# Patient Record
Sex: Male | Born: 1945 | Race: Black or African American | Hispanic: No | Marital: Married | Smoking: Never smoker
Health system: Southern US, Community
[De-identification: ages and names within clinical notes are randomized; demographics above are authoritative.]

## PROBLEM LIST (undated history)

## (undated) DIAGNOSIS — M75100 Unspecified rotator cuff tear or rupture of unspecified shoulder, not specified as traumatic: Secondary | ICD-10-CM

## (undated) DIAGNOSIS — E119 Type 2 diabetes mellitus without complications: Secondary | ICD-10-CM

## (undated) DIAGNOSIS — N400 Enlarged prostate without lower urinary tract symptoms: Secondary | ICD-10-CM

## (undated) DIAGNOSIS — M1990 Unspecified osteoarthritis, unspecified site: Secondary | ICD-10-CM

## (undated) DIAGNOSIS — Z9189 Other specified personal risk factors, not elsewhere classified: Secondary | ICD-10-CM

## (undated) DIAGNOSIS — R519 Headache, unspecified: Secondary | ICD-10-CM

## (undated) DIAGNOSIS — J45909 Unspecified asthma, uncomplicated: Secondary | ICD-10-CM

## (undated) DIAGNOSIS — I639 Cerebral infarction, unspecified: Secondary | ICD-10-CM

## (undated) DIAGNOSIS — M109 Gout, unspecified: Secondary | ICD-10-CM

## (undated) DIAGNOSIS — R51 Headache: Secondary | ICD-10-CM

## (undated) DIAGNOSIS — M459 Ankylosing spondylitis of unspecified sites in spine: Secondary | ICD-10-CM

## (undated) DIAGNOSIS — I10 Essential (primary) hypertension: Secondary | ICD-10-CM

## (undated) DIAGNOSIS — Z889 Allergy status to unspecified drugs, medicaments and biological substances status: Secondary | ICD-10-CM

## (undated) DIAGNOSIS — G4730 Sleep apnea, unspecified: Secondary | ICD-10-CM

## (undated) DIAGNOSIS — Z9889 Other specified postprocedural states: Secondary | ICD-10-CM

## (undated) HISTORY — PX: NASAL SINUS SURGERY: SHX719

## (undated) HISTORY — PX: KNEE ARTHROSCOPY: SHX127

## (undated) HISTORY — PX: UVULOPALATOPHARYNGOPLASTY: SHX827

---

## 2005-08-22 ENCOUNTER — Ambulatory Visit: Payer: Self-pay | Admitting: Gastroenterology

## 2006-07-21 ENCOUNTER — Ambulatory Visit: Payer: Self-pay | Admitting: Internal Medicine

## 2006-12-03 ENCOUNTER — Ambulatory Visit: Payer: Self-pay | Admitting: Internal Medicine

## 2007-07-17 ENCOUNTER — Ambulatory Visit: Payer: Self-pay | Admitting: Otolaryngology

## 2008-10-28 ENCOUNTER — Ambulatory Visit: Payer: Self-pay | Admitting: Gastroenterology

## 2008-11-06 ENCOUNTER — Ambulatory Visit: Payer: Self-pay | Admitting: Otolaryngology

## 2010-02-09 ENCOUNTER — Ambulatory Visit: Payer: Self-pay | Admitting: Otolaryngology

## 2010-02-18 ENCOUNTER — Ambulatory Visit: Payer: Self-pay | Admitting: Otolaryngology

## 2010-02-25 ENCOUNTER — Ambulatory Visit: Payer: Self-pay | Admitting: Otolaryngology

## 2011-05-26 ENCOUNTER — Other Ambulatory Visit: Payer: Self-pay | Admitting: Rheumatology

## 2011-05-26 LAB — SYNOVIAL CELL COUNT + DIFF, W/ CRYSTALS
Basophil: 0 %
Lymphocytes: 7 %
Neutrophils: 92 %
Nucleated Cell Count: 27103 /mm3
Other Cells BF: 0 %
Other Mononuclear Cells: 1 %

## 2011-06-14 ENCOUNTER — Encounter: Payer: Self-pay | Admitting: Urology

## 2011-06-15 ENCOUNTER — Encounter: Payer: Self-pay | Admitting: Urology

## 2011-06-22 LAB — HEMOGLOBIN: HGB: 15.3 g/dL (ref 13.0–18.0)

## 2011-06-23 ENCOUNTER — Ambulatory Visit: Payer: Self-pay | Admitting: Rheumatology

## 2011-07-16 ENCOUNTER — Encounter: Payer: Self-pay | Admitting: Urology

## 2011-10-05 ENCOUNTER — Encounter: Payer: Self-pay | Admitting: Urology

## 2011-10-05 LAB — CBC WITH DIFFERENTIAL/PLATELET
Basophil #: 0.1 10*3/uL (ref 0.0–0.1)
Eosinophil #: 0.4 10*3/uL (ref 0.0–0.7)
Lymphocyte #: 2.7 10*3/uL (ref 1.0–3.6)
MCV: 88 fL (ref 80–100)
Monocyte %: 9.6 %
Neutrophil #: 1.8 10*3/uL (ref 1.4–6.5)
Neutrophil %: 32.5 %
Platelet: 173 10*3/uL (ref 150–440)
RBC: 6.15 10*6/uL — ABNORMAL HIGH (ref 4.40–5.90)
RDW: 14.5 % (ref 11.5–14.5)
WBC: 5.5 10*3/uL (ref 3.8–10.6)

## 2011-10-12 LAB — HEMATOCRIT: HCT: 48.1 % (ref 40.0–52.0)

## 2011-11-23 ENCOUNTER — Ambulatory Visit: Payer: Self-pay | Admitting: Gastroenterology

## 2011-12-27 ENCOUNTER — Ambulatory Visit: Payer: Self-pay | Admitting: Gastroenterology

## 2012-12-13 ENCOUNTER — Ambulatory Visit: Payer: Self-pay | Admitting: Specialist

## 2013-03-12 ENCOUNTER — Ambulatory Visit: Payer: Self-pay | Admitting: Internal Medicine

## 2013-08-28 DIAGNOSIS — R059 Cough, unspecified: Secondary | ICD-10-CM | POA: Insufficient documentation

## 2013-08-28 DIAGNOSIS — J45909 Unspecified asthma, uncomplicated: Secondary | ICD-10-CM | POA: Diagnosis present

## 2013-08-29 DIAGNOSIS — K635 Polyp of colon: Secondary | ICD-10-CM | POA: Insufficient documentation

## 2013-08-29 DIAGNOSIS — Z87898 Personal history of other specified conditions: Secondary | ICD-10-CM | POA: Insufficient documentation

## 2013-08-29 DIAGNOSIS — M1990 Unspecified osteoarthritis, unspecified site: Secondary | ICD-10-CM | POA: Insufficient documentation

## 2013-08-29 DIAGNOSIS — Z87438 Personal history of other diseases of male genital organs: Secondary | ICD-10-CM | POA: Diagnosis present

## 2013-08-29 DIAGNOSIS — E119 Type 2 diabetes mellitus without complications: Secondary | ICD-10-CM | POA: Insufficient documentation

## 2013-08-29 DIAGNOSIS — M459 Ankylosing spondylitis of unspecified sites in spine: Secondary | ICD-10-CM | POA: Insufficient documentation

## 2013-08-29 DIAGNOSIS — D126 Benign neoplasm of colon, unspecified: Secondary | ICD-10-CM | POA: Insufficient documentation

## 2013-08-29 DIAGNOSIS — E785 Hyperlipidemia, unspecified: Secondary | ICD-10-CM | POA: Insufficient documentation

## 2013-11-28 DIAGNOSIS — J329 Chronic sinusitis, unspecified: Secondary | ICD-10-CM | POA: Diagnosis present

## 2013-12-04 ENCOUNTER — Ambulatory Visit: Payer: Self-pay | Admitting: Internal Medicine

## 2014-05-06 ENCOUNTER — Ambulatory Visit: Payer: Self-pay | Admitting: Rheumatology

## 2014-05-28 DIAGNOSIS — M7582 Other shoulder lesions, left shoulder: Secondary | ICD-10-CM | POA: Insufficient documentation

## 2014-06-23 ENCOUNTER — Other Ambulatory Visit: Payer: Self-pay

## 2014-07-03 ENCOUNTER — Ambulatory Visit: Admit: 2014-07-03 | Payer: Self-pay

## 2014-07-03 SURGERY — ARTHROSCOPY, SHOULDER
Anesthesia: Choice | Laterality: Left

## 2014-07-23 ENCOUNTER — Encounter
Admission: RE | Disposition: A | Discharge status: Home / Self Care | Payer: Self-pay | Source: Ambulatory Visit | Attending: Surgery

## 2014-07-23 ENCOUNTER — Ambulatory Visit: Payer: Medicare Other | Admitting: Anesthesiology

## 2014-07-23 ENCOUNTER — Ambulatory Visit
Admission: RE | Admit: 2014-07-23 | Discharge: 2014-07-23 | Disposition: A | Discharge status: Home / Self Care | Payer: Medicare Other | Source: Ambulatory Visit | Attending: Surgery | Admitting: Surgery

## 2014-07-23 DIAGNOSIS — D72820 Lymphocytosis (symptomatic): Secondary | ICD-10-CM | POA: Insufficient documentation

## 2014-07-23 DIAGNOSIS — M459 Ankylosing spondylitis of unspecified sites in spine: Secondary | ICD-10-CM | POA: Insufficient documentation

## 2014-07-23 DIAGNOSIS — M75102 Unspecified rotator cuff tear or rupture of left shoulder, not specified as traumatic: Secondary | ICD-10-CM | POA: Insufficient documentation

## 2014-07-23 DIAGNOSIS — E559 Vitamin D deficiency, unspecified: Secondary | ICD-10-CM | POA: Insufficient documentation

## 2014-07-23 DIAGNOSIS — Z8041 Family history of malignant neoplasm of ovary: Secondary | ICD-10-CM | POA: Insufficient documentation

## 2014-07-23 DIAGNOSIS — M7592 Shoulder lesion, unspecified, left shoulder: Secondary | ICD-10-CM | POA: Diagnosis present

## 2014-07-23 DIAGNOSIS — J45909 Unspecified asthma, uncomplicated: Secondary | ICD-10-CM | POA: Insufficient documentation

## 2014-07-23 DIAGNOSIS — N400 Enlarged prostate without lower urinary tract symptoms: Secondary | ICD-10-CM | POA: Insufficient documentation

## 2014-07-23 DIAGNOSIS — Z823 Family history of stroke: Secondary | ICD-10-CM | POA: Insufficient documentation

## 2014-07-23 DIAGNOSIS — Z79899 Other long term (current) drug therapy: Secondary | ICD-10-CM | POA: Diagnosis not present

## 2014-07-23 DIAGNOSIS — I10 Essential (primary) hypertension: Secondary | ICD-10-CM | POA: Insufficient documentation

## 2014-07-23 DIAGNOSIS — E119 Type 2 diabetes mellitus without complications: Secondary | ICD-10-CM | POA: Diagnosis not present

## 2014-07-23 DIAGNOSIS — M1990 Unspecified osteoarthritis, unspecified site: Secondary | ICD-10-CM | POA: Insufficient documentation

## 2014-07-23 DIAGNOSIS — M7542 Impingement syndrome of left shoulder: Secondary | ICD-10-CM | POA: Diagnosis present

## 2014-07-23 DIAGNOSIS — G608 Other hereditary and idiopathic neuropathies: Secondary | ICD-10-CM | POA: Insufficient documentation

## 2014-07-23 HISTORY — DX: Ankylosing spondylitis of unspecified sites in spine: M45.9

## 2014-07-23 HISTORY — DX: Other specified personal risk factors, not elsewhere classified: Z91.89

## 2014-07-23 HISTORY — PX: BICEPT TENODESIS: SHX5116

## 2014-07-23 HISTORY — DX: Sleep apnea, unspecified: G47.30

## 2014-07-23 HISTORY — DX: Unspecified osteoarthritis, unspecified site: M19.90

## 2014-07-23 HISTORY — DX: Other specified postprocedural states: Z98.890

## 2014-07-23 HISTORY — DX: Headache, unspecified: R51.9

## 2014-07-23 HISTORY — DX: Type 2 diabetes mellitus without complications: E11.9

## 2014-07-23 HISTORY — DX: Unspecified rotator cuff tear or rupture of unspecified shoulder, not specified as traumatic: M75.100

## 2014-07-23 HISTORY — DX: Headache: R51

## 2014-07-23 HISTORY — PX: SHOULDER ARTHROSCOPY WITH ROTATOR CUFF REPAIR AND SUBACROMIAL DECOMPRESSION: SHX5686

## 2014-07-23 HISTORY — DX: Unspecified asthma, uncomplicated: J45.909

## 2014-07-23 HISTORY — DX: Allergy status to unspecified drugs, medicaments and biological substances: Z88.9

## 2014-07-23 LAB — GLUCOSE, CAPILLARY
GLUCOSE-CAPILLARY: 82 mg/dL (ref 65–99)
Glucose-Capillary: 129 mg/dL — ABNORMAL HIGH (ref 65–99)

## 2014-07-23 SURGERY — SHOULDER ARTHROSCOPY WITH ROTATOR CUFF REPAIR AND SUBACROMIAL DECOMPRESSION
Anesthesia: Regional | Site: Shoulder | Laterality: Left | Wound class: Clean

## 2014-07-23 MED ORDER — ONDANSETRON HCL 4 MG/2ML IJ SOLN: 4.0000 mg | Freq: Four times a day (QID) | INTRAMUSCULAR | Status: DC | PRN

## 2014-07-23 MED ORDER — HYDROMORPHONE HCL 1 MG/ML IJ SOLN: 0.2500 mg | INTRAMUSCULAR | Status: DC | PRN

## 2014-07-23 MED ORDER — OXYCODONE HCL 5 MG PO TABS: 5.0000 mg | ORAL_TABLET | ORAL | Status: DC | PRN

## 2014-07-23 MED ORDER — OXYCODONE HCL 5 MG/5ML PO SOLN: 5.0000 mg | Freq: Once | ORAL | Status: DC | PRN

## 2014-07-23 MED ORDER — EPINEPHRINE HCL 1 MG/ML IJ SOLN
INTRAMUSCULAR | Status: DC | PRN
  Administered 2014-07-23: 3900 mL

## 2014-07-23 MED ORDER — ROPIVACAINE HCL 5 MG/ML IJ SOLN
INTRAMUSCULAR | Status: DC | PRN
  Administered 2014-07-23: 30 mL via PERINEURAL

## 2014-07-23 MED ORDER — METOCLOPRAMIDE HCL 5 MG PO TABS: 5.0000 mg | ORAL_TABLET | Freq: Three times a day (TID) | ORAL | Status: DC | PRN

## 2014-07-23 MED ORDER — MEPERIDINE HCL 25 MG/ML IJ SOLN: 6.2500 mg | INTRAMUSCULAR | Status: DC | PRN

## 2014-07-23 MED ORDER — ONDANSETRON HCL 4 MG/2ML IJ SOLN
INTRAMUSCULAR | Status: DC | PRN
  Administered 2014-07-23: 4 mg via INTRAVENOUS

## 2014-07-23 MED ORDER — GLYCOPYRROLATE 0.2 MG/ML IJ SOLN
INTRAMUSCULAR | Status: DC | PRN
  Administered 2014-07-23: 0.1 mg via INTRAVENOUS

## 2014-07-23 MED ORDER — LACTATED RINGERS IV SOLN
INTRAVENOUS | Status: DC
  Administered 2014-07-23 (×2): via INTRAVENOUS

## 2014-07-23 MED ORDER — DEXAMETHASONE SODIUM PHOSPHATE 4 MG/ML IJ SOLN
INTRAMUSCULAR | Status: DC | PRN
  Administered 2014-07-23: 4 mg via INTRAVENOUS
  Administered 2014-07-23: 4 mg via PERINEURAL

## 2014-07-23 MED ORDER — BUPIVACAINE-EPINEPHRINE (PF) 0.5% -1:200000 IJ SOLN
INTRAMUSCULAR | Status: DC | PRN
  Administered 2014-07-23: 30 mL via PERINEURAL

## 2014-07-23 MED ORDER — ACETAMINOPHEN 160 MG/5ML PO SOLN: 325.0000 mg | ORAL | Status: DC | PRN

## 2014-07-23 MED ORDER — ONDANSETRON HCL 4 MG PO TABS: 4.0000 mg | ORAL_TABLET | Freq: Four times a day (QID) | ORAL | Status: DC | PRN

## 2014-07-23 MED ORDER — LIDOCAINE HCL (CARDIAC) 20 MG/ML IV SOLN
INTRAVENOUS | Status: DC | PRN
  Administered 2014-07-23: 40 mg via INTRATRACHEAL

## 2014-07-23 MED ORDER — ACETAMINOPHEN 325 MG PO TABS: 325.0000 mg | ORAL_TABLET | ORAL | Status: DC | PRN

## 2014-07-23 MED ORDER — CEFAZOLIN SODIUM-DEXTROSE 2-3 GM-% IV SOLR
2.0000 g | Freq: Once | INTRAVENOUS | Status: AC
  Administered 2014-07-23: 2 g via INTRAVENOUS

## 2014-07-23 MED ORDER — PROPOFOL 10 MG/ML IV BOLUS
INTRAVENOUS | Status: DC | PRN
  Administered 2014-07-23: 140 mg via INTRAVENOUS

## 2014-07-23 MED ORDER — ONDANSETRON HCL 4 MG/2ML IJ SOLN: 4.0000 mg | Freq: Once | INTRAMUSCULAR | Status: DC | PRN

## 2014-07-23 MED ORDER — EPHEDRINE SULFATE 50 MG/ML IJ SOLN
INTRAMUSCULAR | Status: DC | PRN
  Administered 2014-07-23 (×2): 5 mg via INTRAVENOUS
  Administered 2014-07-23: 10 mg via INTRAVENOUS

## 2014-07-23 MED ORDER — OXYCODONE HCL 5 MG PO TABS: 5.0000 mg | ORAL_TABLET | Freq: Once | ORAL | Status: DC | PRN

## 2014-07-23 MED ORDER — METOCLOPRAMIDE HCL 5 MG/ML IJ SOLN
5.0000 mg | Freq: Three times a day (TID) | INTRAMUSCULAR | Status: DC | PRN
Start: 2014-07-23 — End: 2014-07-23

## 2014-07-23 MED ORDER — MIDAZOLAM HCL 5 MG/5ML IJ SOLN
INTRAMUSCULAR | Status: DC | PRN
  Administered 2014-07-23 (×2): 2 mg via INTRAVENOUS

## 2014-07-23 SURGICAL SUPPLY — 41 items
ADAPTER IRRIG TUBE 2 SPIKE SOL (ADAPTER) IMPLANT
ANCHOR JUGGERKNOT WTAP NDL 2.9 (Anchor) ×8 IMPLANT
BIT DRILL JUGRKNT W/NDL BIT2.9 (DRILL) ×2 IMPLANT
BLADE FULL RADIUS 3.5 (BLADE) ×4 IMPLANT
BLADE SHAVER 4.5X7 STR FR (MISCELLANEOUS) IMPLANT
BUR ACROMIONIZER 4.0 (BURR) ×4 IMPLANT
BUR BR 5.5 WIDE MOUTH (BURR) IMPLANT
CHLORAPREP W/TINT 26ML (MISCELLANEOUS) ×4 IMPLANT
COVER LIGHT HANDLE UNIVERSAL (MISCELLANEOUS) ×8 IMPLANT
DRAPE SURG 17X11 SM STRL (DRAPES) ×8 IMPLANT
DRILL JUGGERKNOT W/NDL BIT 2.9 (DRILL) ×4
GAUZE PETRO XEROFOAM 1X8 (MISCELLANEOUS) ×4 IMPLANT
GAUZE SPONGE 4X4 12PLY STRL (GAUZE/BANDAGES/DRESSINGS) ×4 IMPLANT
GLOVE BIO SURGEON STRL SZ8 (GLOVE) ×12 IMPLANT
GLOVE INDICATOR 8.0 STRL GRN (GLOVE) ×12 IMPLANT
GOWN STRL REUS W/ TWL LRG LVL3 (GOWN DISPOSABLE) ×2 IMPLANT
GOWN STRL REUS W/ TWL XL LVL3 (GOWN DISPOSABLE) ×4 IMPLANT
GOWN STRL REUS W/TWL LRG LVL3 (GOWN DISPOSABLE) ×2
GOWN STRL REUS W/TWL XL LVL3 (GOWN DISPOSABLE) ×4
IV LACTATED RINGER IRRG 3000ML (IV SOLUTION) ×4
IV LR IRRIG 3000ML ARTHROMATIC (IV SOLUTION) ×4 IMPLANT
Juggerknot soft anchors (Anchor) ×7 IMPLANT
KIT CANNULA 8X76-LX IN CANNULA (CANNULA) ×4 IMPLANT
MANIFOLD 4PT FOR NEPTUNE1 (MISCELLANEOUS) ×4 IMPLANT
MAT BLUE FLOOR 46X72 FLO (MISCELLANEOUS) ×4 IMPLANT
NDL MAYO CATGUT SZ5 (NEEDLE)
NDL SUT 5 .5 CRC TPR PNT MAYO (NEEDLE) IMPLANT
NEEDLE HYPO 21X1.5 SAFETY (NEEDLE) ×8 IMPLANT
PACK ARTHROSCOPY KNEE (MISCELLANEOUS) ×4 IMPLANT
PAD GROUND ADULT SPLIT (MISCELLANEOUS) ×4 IMPLANT
SLING ULTRA II LG (MISCELLANEOUS) ×4 IMPLANT
STAPLER SKIN PROX 35W (STAPLE) IMPLANT
STRAP BODY AND KNEE 60X3 (MISCELLANEOUS) ×8 IMPLANT
SUT ETHIBOND 0 MO6 C/R (SUTURE) ×4 IMPLANT
SUT VIC AB 2-0 CT1 27 (SUTURE) ×4
SUT VIC AB 2-0 CT1 TAPERPNT 27 (SUTURE) ×4 IMPLANT
SYR 30ML LL (SYRINGE) ×4 IMPLANT
SYR 5ML LL (SYRINGE) ×4 IMPLANT
TAPE MICROFOAM 4IN (TAPE) ×4 IMPLANT
TUBING ARTHRO INFLOW-ONLY STRL (TUBING) ×4 IMPLANT
WAND HAND CNTRL MULTIVAC 90 (MISCELLANEOUS) ×8 IMPLANT

## 2014-07-23 NOTE — H&P (Signed)
Paper H&P to be scanned into permanent record. H&P reviewed. No changes. 

## 2014-07-23 NOTE — Anesthesia Postprocedure Evaluation (Signed)
  Anesthesia Post-op Note  Patient: Jason Doyle  Procedure(s) Performed: Procedure(s): SHOULDER ARTHROSCOPY WITH ROTATOR CUFF REPAIR AND SUBACROMIAL DECOMPRESSION (Left) BICEPS TENODESIS (Left)  Anesthesia type:Regional, General LMA  Patient location: PACU  Post pain: Pain level controlled  Post assessment: Post-op Vital signs reviewed, Patient's Cardiovascular Status Stable, Respiratory Function Stable, Patent Airway and No signs of Nausea or vomiting  Post vital signs: Reviewed and stable  Last Vitals:  Filed Vitals:   07/23/14 1614  BP: 158/80  Pulse: 84  Temp: 36.6 C  Resp: 12    Level of consciousness: awake, alert  and patient cooperative  Complications: No apparent anesthesia complications

## 2014-07-23 NOTE — Op Note (Signed)
07/23/2014  4:15 PM  Patient:   Jason Doyle  Pre-Op Diagnosis:   Impingement/tendinopathy with possible rotator cuff tear, left shoulder.  Postoperative diagnosis: Impingement/tendinopathy with partial-thickness rotator cuff tear and biceps tendinopathy, left shoulder.  Procedure: Limited arthroscopic debridement, arthroscopic subacromial decompression, mini-open rotator cuff repair, and mini-open biceps tenodesis, left shoulder.  Anesthesia: General endotracheal with interscalene block placed preoperatively by the anesthesiologist.  Findings: As above. There was an area of near full-thickness tearing of the anterior insertional fibers of the supraspinatus tendon on its articular surface, as well as some superficial fraying of the superior insertional fibers of subscapularis tendon measuring less than 10% of the thickness of the tendon. The biceps tendon demonstrated some tendinopathic changes as manifested by fraying and erythema. The labrum demonstrated some fraying with a flap component postero-superiorly, but otherwise was in satisfactory condition, as were the articular surfaces of the glenoid and humerus. Moderate synovitis was noted as well.  Complications: None  Fluids:   1350 cc  Estimated blood loss: 10 cc  Tourniquet time: None  Drains: None  Closure: Staples   Brief clinical note: The patient is a 69 year old male with a history of persistent left shoulder pain. The patient's symptoms have progressed despite medications, activity modification, etc. The patient's history and examination were consistent with impingement/tendinopathy with a possible partial thickness rotator cuff tear. These findings were confirmed by MRI scan, which also demonstrated a suggestion of biceps tendinopathy. The patient presents at this time for definitive management of these shoulder symptoms.  Procedure: The patient underwent placement of an interscalene block by the  anesthesiologist in the preoperative holding area before he was brought into the operating room and lain in the supine position. He then underwent general laryngal mask anesthesia before being repositioned in the beach chair position using the beach chair position. The left shoulder and upper extremity were prepped with ChloraPrep solution before being draped sterilely. Preoperative antibiotics were administered. A timeout was performed to confirm the proper side was prepped before the expected portal sites and incision site were injected with 0.5% Sensorcaine with epinephrine. A posterior portal was created and the glenohumeral joint thoroughly inspected with the findings as described above. An anterior portal was created using an outside-in technique. The labrum and rotator cuff were further probed, again confirming the above-noted findings. Some superficial labral fraying superiorly was debrided using the full-radius resector, as was the area of partial-thickness tearing of the anterior insertional fibers of the supraspinatus. This clearly involved greater than 50% of the thickness of the tendon in this area and therefore was felt best to proceed with formal repair. The biceps itself was noted to have some tendinopathic changes, so it was elected to proceed with a biceps tenodesis. Therefore, the ArthroCare wand was inserted and used to detach the long head of the biceps tendon from its labral attachment. Inflamed synovial tissues anteriorly and superiorly superiorly also were debrided using the full-radius resector before the ArthroCare wand was used to obtain hemostasis. The instruments were removed from the joint after suctioning the excess fluid.  The camera was repositioned through the posterior portal into the subacromial space. A separate lateral portal was created using an outside-in technique. The 3.5 full-radius resector was introduced and used to perform a subtotal bursectomy. The ArthroCare wand was  then inserted and used to remove the periosteal tissue off the undersurface of the anterior third of the acromion as well as to recess the coracoacromial ligament from its attachment along the anterior and  lateral margins of the acromion. The 4.0 mm acromionizing bur was introduced and used to complete the decompression by removing the undersurface of the anterior third of the acromion. The full radius resector was reintroduced to remove any residual bony debris before the ArthroCare wand was reintroduced to obtain hemostasis. The instruments were then removed from the subacromial space after suctioning the excess fluid.  An approximately 4-5 cm incision was made over the anterolateral aspect of the shoulder beginning at the anterolateral corner of the acromion and extending distally in line with the bicipital groove. This incision was carried down through the subcutaneous tissues to expose the deltoid fascia. The raphae between the anterior and middle thirds was identified and this plane developed to provide access into the subacromial space. Additional bursal tissues were debrided sharply using Metzenbaum scissors. The area of partial-thickness rotator cuff tearing was identified by palpation just lateral to the entrance to the bicipital groove. This area was incised longitudinally with a #15 blade and the exposed greater tuberosity roughened with a rongeur. The tear was repaired using a single Biomet 2.9 mm JuggerKnot anchors placed in a side to side fashion. An additional #0 Ethibond suture also was used to close the distal portion of the incision. An apparent watertight closure was obtained.  The bicipital groove was identified by palpation and opened for 1-1.5 cm. The biceps tendon stump was retrieved through this defect. The floor of the bicipital groove was roughened with a rongeur before another Biomet 2.9 mm JuggerKnot anchor was inserted. Both sets of sutures were passed through the biceps tendon to  effect the tenodesis. The bicipital sheath was reapproximated using two #0 Ethibond interrupted sutures, incorporating the biceps tendon to further reinforce the tenodesis.  The wound was copiously irrigated with sterile saline solution before the deltoid raphae was reapproximated using 2-0 Vicryl interrupted sutures. The subcutaneous tissues were closed in two layers using 2-0 Vicryl interrupted sutures before the skin was closed using staples. The portal sites also were closed using staples. A sterile bulky dressing was applied to the shoulder before the arm was placed into a shoulder immobilizer. The patient was then awakened, extubated, and returned to the recovery room in satisfactory condition after tolerating the procedure well.

## 2014-07-23 NOTE — Transfer of Care (Signed)
Immediate Anesthesia Transfer of Care Note  Patient: Jason Doyle  Procedure(s) Performed: Procedure(s): SHOULDER ARTHROSCOPY WITH ROTATOR CUFF REPAIR AND SUBACROMIAL DECOMPRESSION (Left) BICEPS TENODESIS (Left)  Patient Location: PACU  Anesthesia Type: Regional, General LMA  Level of Consciousness: awake, alert  and patient cooperative  Airway and Oxygen Therapy: Patient Spontanous Breathing and Patient connected to supplemental oxygen  Post-op Assessment: Post-op Vital signs reviewed, Patient's Cardiovascular Status Stable, Respiratory Function Stable, Patent Airway and No signs of Nausea or vomiting  Post-op Vital Signs: Reviewed and stable  Complications: No apparent anesthesia complications

## 2014-07-23 NOTE — Discharge Instructions (Signed)
General Anesthesia, Care After Refer to this sheet in the next few weeks. These instructions provide you with information on caring for yourself after your procedure. Your health care provider may also give you more specific instructions. Your treatment has been planned according to current medical practices, but problems sometimes occur. Call your health care provider if you have any problems or questions after your procedure. WHAT TO EXPECT AFTER THE PROCEDURE After the procedure, it is typical to experience:  Sleepiness.  Nausea and vomiting. HOME CARE INSTRUCTIONS  For the first 24 hours after general anesthesia:  Have a responsible person with you.  Do not drive a car. If you are alone, do not take public transportation.  Do not drink alcohol.  Do not take medicine that has not been prescribed by your health care provider.  Do not sign important papers or make important decisions.  You may resume a normal diet and activities as directed by your health care provider.  Change bandages (dressings) as directed.  If you have questions or problems that seem related to general anesthesia, call the hospital and ask for the anesthetist or anesthesiologist on call. SEEK MEDICAL CARE IF:  You have nausea and vomiting that continue the day after anesthesia.  You develop a rash. SEEK IMMEDIATE MEDICAL CARE IF:   You have difficulty breathing.  You have chest pain.  You have any allergic problems. Document Released: 05/09/2000 Document Revised: 02/05/2013 Document Reviewed: 08/16/2012 [facility] Patient Information 2015 [place], [place]. This information is not intended to replace advice given to you by your health care provider. Make sure you discuss any questions you have with your health care provider.  Keep dressing dry and intact.  May shower after dressing changed on post-op day #4 (Sunday).  Cover staples/sutures with Band-Aids after drying off. Apply ice frequently to  shoulder. Keep shoulder immobilizer on at all times except may remove for bathing purposes. Follow-up in 10-14 days or as scheduled.

## 2014-07-23 NOTE — Anesthesia Procedure Notes (Addendum)
Anesthesia Regional Block:  Interscalene brachial plexus block  Pre-Anesthetic Checklist: ,, timeout performed, Correct Patient, Correct Site, Correct Laterality, Correct Procedure, Correct Position, site marked, Risks and benefits discussed,  Surgical consent,  Pre-op evaluation,  At surgeon's request and post-op pain management  Laterality: Left  Prep: chloraprep       Needles:  Injection technique: Single-shot  Needle Type: Stimiplex     Needle Length: 4cm 4 cm Needle Gauge: 21 and 21 G    Additional Needles:  Procedures: ultrasound guided (picture in chart) Interscalene brachial plexus block Narrative:  Start time: 07/23/2014 1:24 PM End time: 07/23/2014 1:39 PM Injection made incrementally with aspirations every 30 mL.  Performed by: Personally  Anesthesiologist: Marchia Bond D   Procedure Name: LMA Insertion Date/Time: 07/23/2014 2:23 PM Performed by: Mayme Genta Pre-anesthesia Checklist: Patient identified, Emergency Drugs available, Suction available, Timeout performed and Patient being monitored Patient Re-evaluated:Patient Re-evaluated prior to inductionOxygen Delivery Method: Circle system utilized Preoxygenation: Pre-oxygenation with 100% oxygen Intubation Type: IV induction LMA: LMA inserted LMA Size: 4.0 Number of attempts: 1 Placement Confirmation: positive ETCO2 and breath sounds checked- equal and bilateral Tube secured with: Tape

## 2014-07-23 NOTE — Anesthesia Preprocedure Evaluation (Signed)
Anesthesia Evaluation  Patient identified by MRN, date of birth, ID band Patient awake    Reviewed: Allergy & Precautions, H&P , NPO status , Patient's Chart, lab work & pertinent test results, reviewed documented beta blocker date and time   Airway Mallampati: II  TM Distance: >3 FB Neck ROM: full    Dental no notable dental hx.    Pulmonary asthma , sleep apnea and Continuous Positive Airway Pressure Ventilation ,  breath sounds clear to auscultation  Pulmonary exam normal       Cardiovascular Exercise Tolerance: Good negative cardio ROS  Rhythm:regular Rate:Normal     Neuro/Psych  Headaches, negative psych ROS   GI/Hepatic negative GI ROS, Neg liver ROS,   Endo/Other  diabetes, Well Controlled, Type 2, Oral Hypoglycemic Agents  Renal/GU negative Renal ROS  negative genitourinary   Musculoskeletal   Abdominal   Peds  Hematology negative hematology ROS (+)   Anesthesia Other Findings   Reproductive/Obstetrics negative OB ROS                             Anesthesia Physical Anesthesia Plan  ASA: II  Anesthesia Plan: Regional and General LMA   Post-op Pain Management: MAC Combined w/ Regional for Post-op pain   Induction:   Airway Management Planned:   Additional Equipment:   Intra-op Plan:   Post-operative Plan:   Informed Consent: I have reviewed the patients History and Physical, chart, labs and discussed the procedure including the risks, benefits and alternatives for the proposed anesthesia with the patient or authorized representative who has indicated his/her understanding and acceptance.     Plan Discussed with: CRNA  Anesthesia Plan Comments:         Anesthesia Quick Evaluation

## 2014-07-24 ENCOUNTER — Encounter: Payer: Self-pay | Admitting: Surgery

## 2014-07-24 MED ORDER — FENTANYL CITRATE (PF) 100 MCG/2ML IJ SOLN
INTRAMUSCULAR | Status: DC | PRN
  Administered 2014-07-23: 100 ug via INTRAVENOUS

## 2014-07-24 NOTE — Addendum Note (Signed)
Addendum  created 07/24/14 5631 by Esmeralda Links, MD   Modules edited: Anesthesia Medication Administration

## 2014-12-04 ENCOUNTER — Other Ambulatory Visit: Payer: Self-pay | Admitting: Internal Medicine

## 2014-12-04 DIAGNOSIS — K760 Fatty (change of) liver, not elsewhere classified: Secondary | ICD-10-CM

## 2014-12-04 DIAGNOSIS — R748 Abnormal levels of other serum enzymes: Secondary | ICD-10-CM

## 2014-12-09 ENCOUNTER — Ambulatory Visit
Admission: RE | Admit: 2014-12-09 | Discharge: 2014-12-09 | Disposition: A | Discharge status: Home / Self Care | Payer: Medicare Other | Source: Ambulatory Visit | Attending: Internal Medicine | Admitting: Internal Medicine

## 2014-12-09 DIAGNOSIS — K760 Fatty (change of) liver, not elsewhere classified: Secondary | ICD-10-CM | POA: Diagnosis present

## 2014-12-09 DIAGNOSIS — R748 Abnormal levels of other serum enzymes: Secondary | ICD-10-CM | POA: Diagnosis present

## 2015-12-21 DIAGNOSIS — D696 Thrombocytopenia, unspecified: Secondary | ICD-10-CM | POA: Insufficient documentation

## 2016-01-22 ENCOUNTER — Other Ambulatory Visit: Payer: Self-pay | Admitting: Otolaryngology

## 2016-01-22 DIAGNOSIS — H9041 Sensorineural hearing loss, unilateral, right ear, with unrestricted hearing on the contralateral side: Secondary | ICD-10-CM

## 2016-01-22 DIAGNOSIS — IMO0001 Reserved for inherently not codable concepts without codable children: Secondary | ICD-10-CM

## 2016-02-04 ENCOUNTER — Ambulatory Visit
Admission: RE | Admit: 2016-02-04 | Discharge: 2016-02-04 | Disposition: A | Discharge status: Home / Self Care | Payer: Medicare Other | Source: Ambulatory Visit | Attending: Otolaryngology | Admitting: Otolaryngology

## 2016-02-04 DIAGNOSIS — I739 Peripheral vascular disease, unspecified: Secondary | ICD-10-CM | POA: Diagnosis not present

## 2016-02-04 DIAGNOSIS — IMO0001 Reserved for inherently not codable concepts without codable children: Secondary | ICD-10-CM

## 2016-02-04 DIAGNOSIS — H9041 Sensorineural hearing loss, unilateral, right ear, with unrestricted hearing on the contralateral side: Secondary | ICD-10-CM

## 2016-02-04 DIAGNOSIS — G319 Degenerative disease of nervous system, unspecified: Secondary | ICD-10-CM | POA: Insufficient documentation

## 2016-02-04 LAB — POCT I-STAT CREATININE: Creatinine, Ser: 1.2 mg/dL (ref 0.61–1.24)

## 2016-02-04 MED ORDER — GADOBENATE DIMEGLUMINE 529 MG/ML IV SOLN
20.0000 mL | Freq: Once | INTRAVENOUS | Status: AC | PRN
  Administered 2016-02-04: 20 mL via INTRAVENOUS

## 2017-04-07 ENCOUNTER — Encounter: Payer: Self-pay | Admitting: *Deleted

## 2017-04-10 ENCOUNTER — Ambulatory Visit: Payer: Medicare Other | Admitting: Anesthesiology

## 2017-04-10 ENCOUNTER — Encounter: Payer: Self-pay | Admitting: Anesthesiology

## 2017-04-10 ENCOUNTER — Encounter
Admission: RE | Disposition: A | Discharge status: Home / Self Care | Payer: Self-pay | Source: Ambulatory Visit | Attending: Gastroenterology

## 2017-04-10 ENCOUNTER — Ambulatory Visit
Admission: RE | Admit: 2017-04-10 | Discharge: 2017-04-10 | Disposition: A | Discharge status: Home / Self Care | Payer: Medicare Other | Source: Ambulatory Visit | Attending: Gastroenterology | Admitting: Gastroenterology

## 2017-04-10 DIAGNOSIS — Z86010 Personal history of colonic polyps: Secondary | ICD-10-CM | POA: Insufficient documentation

## 2017-04-10 DIAGNOSIS — G4730 Sleep apnea, unspecified: Secondary | ICD-10-CM | POA: Insufficient documentation

## 2017-04-10 DIAGNOSIS — N400 Enlarged prostate without lower urinary tract symptoms: Secondary | ICD-10-CM | POA: Diagnosis not present

## 2017-04-10 DIAGNOSIS — Z7951 Long term (current) use of inhaled steroids: Secondary | ICD-10-CM | POA: Insufficient documentation

## 2017-04-10 DIAGNOSIS — M109 Gout, unspecified: Secondary | ICD-10-CM | POA: Insufficient documentation

## 2017-04-10 DIAGNOSIS — E119 Type 2 diabetes mellitus without complications: Secondary | ICD-10-CM | POA: Insufficient documentation

## 2017-04-10 DIAGNOSIS — I10 Essential (primary) hypertension: Secondary | ICD-10-CM | POA: Diagnosis not present

## 2017-04-10 DIAGNOSIS — M1990 Unspecified osteoarthritis, unspecified site: Secondary | ICD-10-CM | POA: Insufficient documentation

## 2017-04-10 DIAGNOSIS — K635 Polyp of colon: Secondary | ICD-10-CM | POA: Insufficient documentation

## 2017-04-10 DIAGNOSIS — J45909 Unspecified asthma, uncomplicated: Secondary | ICD-10-CM | POA: Insufficient documentation

## 2017-04-10 DIAGNOSIS — Z7984 Long term (current) use of oral hypoglycemic drugs: Secondary | ICD-10-CM | POA: Insufficient documentation

## 2017-04-10 DIAGNOSIS — K5730 Diverticulosis of large intestine without perforation or abscess without bleeding: Secondary | ICD-10-CM | POA: Insufficient documentation

## 2017-04-10 DIAGNOSIS — Z1211 Encounter for screening for malignant neoplasm of colon: Secondary | ICD-10-CM | POA: Insufficient documentation

## 2017-04-10 DIAGNOSIS — Z79899 Other long term (current) drug therapy: Secondary | ICD-10-CM | POA: Insufficient documentation

## 2017-04-10 DIAGNOSIS — D123 Benign neoplasm of transverse colon: Secondary | ICD-10-CM | POA: Insufficient documentation

## 2017-04-10 DIAGNOSIS — D122 Benign neoplasm of ascending colon: Secondary | ICD-10-CM | POA: Insufficient documentation

## 2017-04-10 HISTORY — DX: Essential (primary) hypertension: I10

## 2017-04-10 HISTORY — PX: COLONOSCOPY WITH PROPOFOL: SHX5780

## 2017-04-10 HISTORY — DX: Gout, unspecified: M10.9

## 2017-04-10 HISTORY — DX: Benign prostatic hyperplasia without lower urinary tract symptoms: N40.0

## 2017-04-10 LAB — GLUCOSE, CAPILLARY: Glucose-Capillary: 110 mg/dL — ABNORMAL HIGH (ref 65–99)

## 2017-04-10 SURGERY — COLONOSCOPY WITH PROPOFOL
Anesthesia: General

## 2017-04-10 MED ORDER — LIDOCAINE HCL (PF) 2 % IJ SOLN
INTRAMUSCULAR | Status: AC
  Filled 2017-04-10: qty 10

## 2017-04-10 MED ORDER — PROPOFOL 500 MG/50ML IV EMUL
INTRAVENOUS | Status: AC
  Filled 2017-04-10: qty 50

## 2017-04-10 MED ORDER — PROPOFOL 10 MG/ML IV BOLUS
INTRAVENOUS | Status: DC | PRN
  Administered 2017-04-10: 90 mg via INTRAVENOUS

## 2017-04-10 MED ORDER — PROPOFOL 500 MG/50ML IV EMUL
INTRAVENOUS | Status: DC | PRN
  Administered 2017-04-10: 125 ug/kg/min via INTRAVENOUS

## 2017-04-10 MED ORDER — SODIUM CHLORIDE 0.9 % IV SOLN
INTRAVENOUS | Status: DC
  Administered 2017-04-10: 1000 mL via INTRAVENOUS

## 2017-04-10 MED ORDER — SODIUM CHLORIDE 0.9 % IV SOLN
INTRAVENOUS | Status: DC
  Administered 2017-04-10: 10:00:00 via INTRAVENOUS

## 2017-04-10 MED ORDER — LIDOCAINE HCL (CARDIAC) 20 MG/ML IV SOLN
INTRAVENOUS | Status: DC | PRN
  Administered 2017-04-10: 30 mg via INTRAVENOUS

## 2017-04-10 NOTE — Op Note (Signed)
[facility] Patient Name: Jason Doyle Procedure Date: 04/10/2017 9:30 AM MRN: [id number] Account #: [id number] Date of Birth: 11/15/1945 Admit Type: Outpatient Age: 72 Room: [facility] ROOM 1 Gender: Male Note Status: Finalized Procedure:            Colonoscopy Indications:          Personal history of colonic polyps Providers:            [name], MD Referring MD:         Ramonita Lab, MD (Referring MD) Medicines:            Monitored Anesthesia Care Complications:        No immediate complications. Procedure:            Pre-Anesthesia Assessment:                       - ASA Grade Assessment: III - A patient with severe                        systemic disease.                       After obtaining informed consent, the colonoscope was                        passed under direct vision. Throughout the procedure,                        the patient's blood pressure, pulse, and oxygen                        saturations were monitored continuously. The                        Colonoscope was introduced through the anus and                        advanced to the the cecum, identified by appendiceal                        orifice and ileocecal valve. The quality of the bowel                        preparation was fair. The colonoscopy was unusually                        difficult due to poor bowel prep, significant looping                        and a tortuous colon. Successful completion of the                        procedure was aided by changing the patient to a supine                        position, changing the patient to a prone position,                        using manual pressure and lavage. Findings:      Multiple medium-mouthed diverticula were found in  the sigmoid colon and       descending colon.      A 2 mm polyp was found in the sigmoid colon. The polyp was sessile. The       polyp was removed with a cold biopsy forceps.  Resection and retrieval       were complete.      Two sessile polyps were found in the hepatic flexure. The polyps were 2       to 3 mm in size. These polyps were removed with a cold biopsy forceps.       Resection and retrieval were complete.      A 3 mm polyp was found in the ascending colon. The polyp was sessile.       The polyp was removed with a cold biopsy forceps. Resection and       retrieval were complete.      A 3 mm polyp was found in the proximal transverse colon. The polyp was       sessile. The polyp was removed with a cold biopsy forceps. Resection and       retrieval were complete.      Biopsies for histology were taken with a cold forceps from the right       colon and left colon for evaluation of microscopic colitis.      A 2 mm polyp was found in the descending colon. The polyp was sessile.       The polyp was removed with a cold biopsy forceps. Resection and       retrieval were complete.      Two sessile polyps were found in the distal sigmoid colon. The polyps       were 2 mm in size. These polyps were removed with a cold biopsy forceps.       Resection and retrieval were complete.      Non-bleeding internal hemorrhoids were found during retroflexion and       during anoscopy. The hemorrhoids were small and Grade I (internal       hemorrhoids that do not prolapse).      The digital rectal exam findings include enlarged prostate. Impression:           - Preparation of the colon was fair.                       - Diverticulosis in the sigmoid colon and in the                        descending colon.                       - One 2 mm polyp in the sigmoid colon, removed with a                        cold biopsy forceps. Resected and retrieved.                       - Two 2 to 3 mm polyps at the hepatic flexure, removed                        with a cold biopsy forceps. Resected and retrieved.                       -  One 3 mm polyp in the ascending colon, removed with a                         cold biopsy forceps. Resected and retrieved.                       - One 3 mm polyp in the proximal transverse colon,                        removed with a cold biopsy forceps. Resected and                        retrieved.                       - One 2 mm polyp in the descending colon, removed with                        a cold biopsy forceps. Resected and retrieved.                       - Two 2 mm polyps in the distal sigmoid colon, removed                        with a cold biopsy forceps. Resected and retrieved.                       - Non-bleeding internal hemorrhoids.                       - Enlarged prostate found on digital rectal exam.                       - Biopsies were taken with a cold forceps from the                        right colon and left colon for evaluation of                        microscopic colitis. Recommendation:       - Discharge patient to home.                       - Await pathology results.                       - Telephone GI clinic for pathology results in 1 week.                       - Soft diet for 1 day, then advance as tolerated to                        advance diet as tolerated. Procedure Code(s):    --- Professional ---                       [phone], Colonoscopy, flexible; with biopsy, single or                        multiple Diagnosis Code(s):    --- Professional ---  K64.0, First degree hemorrhoids                       D12.5, Benign neoplasm of sigmoid colon                       D12.2, Benign neoplasm of ascending colon                       D12.3, Benign neoplasm of transverse colon (hepatic                        flexure or splenic flexure)                       D12.4, Benign neoplasm of descending colon                       Z86.010, Personal history of colonic polyps                       K57.30, Diverticulosis of large intestine without                        perforation or abscess without  bleeding                       N40.0, Benign prostatic hyperplasia without lower                        urinary tract symptoms CPT copyright 2016 American Medical Association. All rights reserved. The codes documented in this report are preliminary and upon coder review may  be revised to meet current compliance requirements. [name], MD 04/10/2017 10:44:36 AM This report has been signed electronically. Number of Addenda: 0 Note Initiated On: 04/10/2017 9:30 AM Scope Withdrawal Time: 0 hours 11 minutes 44 seconds  Total Procedure Duration: 0 hours 42 minutes 48 seconds       [facility]

## 2017-04-10 NOTE — Anesthesia Preprocedure Evaluation (Signed)
Anesthesia Evaluation  Patient identified by MRN, date of birth, ID band Patient awake    Reviewed: Allergy & Precautions, H&P , NPO status , Patient's Chart, lab work & pertinent test results  History of Anesthesia Complications Negative for: history of anesthetic complications  Airway Mallampati: III  TM Distance: >3 FB Neck ROM: limited    Dental  (+) Chipped   Pulmonary neg shortness of breath, asthma , sleep apnea ,           Cardiovascular Exercise Tolerance: Good hypertension, (-) angina(-) Past MI and (-) DOE      Neuro/Psych  Headaches, negative psych ROS   GI/Hepatic negative GI ROS, Neg liver ROS,   Endo/Other  diabetes, Type 2  Renal/GU negative Renal ROS  negative genitourinary   Musculoskeletal  (+) Arthritis ,   Abdominal   Peds  Hematology negative hematology ROS (+)   Anesthesia Other Findings Past Medical History: No date: Ankylosing spondylitis (HCC) No date: Arthritis     Comment:  knee,hands,back and neck No date: Asthma     Comment:  LAST ATTACK 4-5 YRS AGO No date: BPH (benign prostatic hyperplasia) No date: Diabetes mellitus without complication (HCC)     Comment:  TYPE 2 No date: Gout No date: H/O multiple allergies     Comment:  SEASONAL,ENVIRONMENTAL No date: H/O sinus surgery     Comment:  x4 No date: Headache     Comment:  sinus No date: Hypertension No date: Rotator cuff tear     Comment:  left shoulder No date: Sleep apnea     Comment:  C-PAP  Past Surgical History: 07/23/2014: BICEPT TENODESIS; Left     Comment:  Procedure: BICEPS TENODESIS;  Surgeon: [name], MD;              Location: [facility];  Service: Orthopedics;                Laterality: Left; No date: KNEE ARTHROSCOPY No date: NASAL SINUS SURGERY     Comment:  x 4 07/23/2014: SHOULDER ARTHROSCOPY WITH ROTATOR CUFF REPAIR AND  SUBACROMIAL DECOMPRESSION; Left     Comment:  Procedure: SHOULDER  ARTHROSCOPY WITH ROTATOR CUFF REPAIR              AND SUBACROMIAL DECOMPRESSION;  Surgeon: [name],               MD;  Location: [facility];  Service:               Orthopedics;  Laterality: Left; No date: UVULOPALATOPHARYNGOPLASTY  BMI    Body Mass Index:  30.56 kg/m      Reproductive/Obstetrics negative OB ROS                             Anesthesia Physical Anesthesia Plan  ASA: III  Anesthesia Plan: General   Post-op Pain Management:    Induction: Intravenous  PONV Risk Score and Plan: Propofol infusion and TIVA  Airway Management Planned: Natural Airway and Nasal Cannula  Additional Equipment:   Intra-op Plan:   Post-operative Plan:   Informed Consent: I have reviewed the patients History and Physical, chart, labs and discussed the procedure including the risks, benefits and alternatives for the proposed anesthesia with the patient or authorized representative who has indicated his/her understanding and acceptance.   Dental Advisory Given  Plan Discussed with: Anesthesiologist, CRNA and Surgeon  Anesthesia Plan Comments: (  Patient consented for risks of anesthesia including but not limited to:  - adverse reactions to medications - risk of intubation if required - damage to teeth, lips or other oral mucosa - sore throat or hoarseness - Damage to heart, brain, lungs or loss of life  Patient voiced understanding.)        Anesthesia Quick Evaluation

## 2017-04-10 NOTE — Anesthesia Postprocedure Evaluation (Signed)
Anesthesia Post Note  Patient: Jason Doyle  Procedure(s) Performed: COLONOSCOPY WITH PROPOFOL (N/A )  Patient location during evaluation: Endoscopy Anesthesia Type: General Level of consciousness: awake and alert Pain management: pain level controlled Vital Signs Assessment: post-procedure vital signs reviewed and stable Respiratory status: spontaneous breathing, nonlabored ventilation, respiratory function stable and patient connected to nasal cannula oxygen Cardiovascular status: blood pressure returned to baseline and stable Postop Assessment: no apparent nausea or vomiting Anesthetic complications: no     Last Vitals:  Vitals:   04/10/17 0808 04/10/17 1045  BP: (!) 141/92   Pulse: 66   Resp: 16   Temp: (!) 35.8 C (!) 35.6 C  SpO2: 100%     Last Pain:  Vitals:   04/10/17 1045  TempSrc: Tympanic                 Jason Doyle

## 2017-04-10 NOTE — Anesthesia Post-op Follow-up Note (Signed)
Anesthesia QCDR form completed.        

## 2017-04-10 NOTE — Transfer of Care (Signed)
Immediate Anesthesia Transfer of Care Note  Patient: Jason Doyle  Procedure(s) Performed: COLONOSCOPY WITH PROPOFOL (N/A )  Patient Location: PACU and Endoscopy Unit  Anesthesia Type:General  Level of Consciousness: awake  Airway & Oxygen Therapy: Patient Spontanous Breathing  Post-op Assessment: Report given to RN  Post vital signs: stable  Last Vitals:  Vitals:   04/10/17 0808 04/10/17 1045  BP: (!) 141/92   Pulse: 66   Resp: 16   Temp: (!) 35.8 C (!) (P) 35.6 C  SpO2: 100%     Last Pain:  Vitals:   04/10/17 1045  TempSrc: (P) Tympanic         Complications: No apparent anesthesia complications

## 2017-04-10 NOTE — H&P (Signed)
Outpatient short stay form Pre-procedure 04/10/2017 9:39 AM [name] MD  Primary Physician: Dr. Ramonita Lab  Reason for visit: Colonoscopy  History of present illness: Patient is a 72 year old male presenting today as above.  He has personal history of adenomatous colon polyps.  He tolerated his prep well.  He takes no aspirin or blood thinning agent.  Patient does have a history of abnormal liver associated enzymes.  It is of note he does take allopurinol and has for many years.  Does not take the colchicine.    Current Facility-Administered Medications:  .  0.9 %  sodium chloride infusion, , Intravenous, Continuous, [name], MD, Last Rate: 20 mL/hr at 04/10/17 0823, 1,000 mL at 04/10/17 0823 .  0.9 %  sodium chloride infusion, , Intravenous, Continuous, [name], MD  Medications Prior to Admission  Medication Sig Dispense Refill Last Dose  . albuterol (PROVENTIL HFA;VENTOLIN HFA) 108 (90 BASE) MCG/ACT inhaler Inhale 2 puffs into the lungs as needed for wheezing or shortness of breath.   04/10/2017 at 0600  . allopurinol (ZYLOPRIM) 100 MG tablet Take 100 mg by mouth daily. am   04/09/2017 at Unknown time  . amLODipine (NORVASC) 5 MG tablet Take 5 mg by mouth daily. am   04/09/2017 at Unknown time  . atorvastatin (LIPITOR) 80 MG tablet Take 80 mg by mouth daily.   04/09/2017 at Unknown time  . budesonide-formoterol (SYMBICORT) 160-4.5 MCG/ACT inhaler Inhale 2 puffs into the lungs daily. am   04/09/2017 at Unknown time  . Cholecalciferol (VITAMIN D) 2000 UNITS tablet Take 2,000 Units by mouth daily. am   04/09/2017 at Unknown time  . etanercept (ENBREL) 50 MG/ML injection Inject 50 mg into the skin once a week.   04/09/2017 at Unknown time  . lisinopril (PRINIVIL,ZESTRIL) 40 MG tablet Take 40 mg by mouth daily.   04/09/2017 at Unknown time  . loratadine (CLARITIN) 10 MG tablet Take 10 mg by mouth daily.   04/09/2017 at Unknown time  . metFORMIN (GLUCOPHAGE) 500 MG  tablet Take by mouth 2 (two) times daily with a meal.   Past Week at Unknown time  . montelukast (SINGULAIR) 10 MG tablet Take 10 mg by mouth at bedtime.   Past Month at Unknown time  . tamsulosin (FLOMAX) 0.4 MG CAPS capsule Take 0.4 mg by mouth daily after breakfast.   Past Month at Unknown time  . tiZANidine (ZANAFLEX) 4 MG capsule Take 4 mg by mouth daily. Pm   04/09/2017 at Unknown time  . valsartan (DIOVAN) 320 MG tablet Take 320 mg by mouth daily. Am   04/09/2017 at Unknown time     No Known Allergies   Past Medical History:  Diagnosis Date  . Ankylosing spondylitis ([place])   . Arthritis    knee,hands,back and neck  . Asthma    LAST ATTACK 4-5 YRS AGO  . BPH (benign prostatic hyperplasia)   . Diabetes mellitus without complication ([place])    TYPE 2  . Gout   . H/O multiple allergies    SEASONAL,ENVIRONMENTAL  . H/O sinus surgery    x4  . Headache    sinus  . Hypertension   . Rotator cuff tear    left shoulder  . Sleep apnea    C-PAP    Review of systems:      Physical Exam    Heart and lungs: There are.    HEENT: Normocephalic atraumatic eyes are anicteric    Other:  Pertinant exam for procedure: Soft protuberant nontender nondistended bowel sounds positive normoactive    Planned proceedures: Colonoscopy and indicated procedures. I have discussed the risks benefits and complications of procedures to include not limited to bleeding, infection, perforation and the risk of sedation and the patient wishes to proceed.    [name], MD Gastroenterology 04/10/2017  9:39 AM

## 2017-04-11 ENCOUNTER — Encounter: Payer: Self-pay | Admitting: Gastroenterology

## 2017-04-12 LAB — SURGICAL PATHOLOGY

## 2017-07-11 ENCOUNTER — Other Ambulatory Visit: Payer: Self-pay | Admitting: Gastroenterology

## 2017-07-11 DIAGNOSIS — R748 Abnormal levels of other serum enzymes: Secondary | ICD-10-CM

## 2017-07-11 DIAGNOSIS — K760 Fatty (change of) liver, not elsewhere classified: Secondary | ICD-10-CM

## 2017-07-11 DIAGNOSIS — R7989 Other specified abnormal findings of blood chemistry: Secondary | ICD-10-CM

## 2017-07-11 DIAGNOSIS — R945 Abnormal results of liver function studies: Principal | ICD-10-CM

## 2017-07-11 DIAGNOSIS — B182 Chronic viral hepatitis C: Secondary | ICD-10-CM

## 2017-07-17 ENCOUNTER — Ambulatory Visit
Admission: RE | Admit: 2017-07-17 | Discharge: 2017-07-17 | Disposition: A | Discharge status: Home / Self Care | Payer: Medicare Other | Source: Ambulatory Visit | Attending: Gastroenterology | Admitting: Gastroenterology

## 2017-07-17 DIAGNOSIS — B182 Chronic viral hepatitis C: Secondary | ICD-10-CM | POA: Insufficient documentation

## 2017-07-17 DIAGNOSIS — R945 Abnormal results of liver function studies: Secondary | ICD-10-CM | POA: Insufficient documentation

## 2017-10-27 ENCOUNTER — Encounter: Payer: Self-pay | Admitting: Emergency Medicine

## 2017-10-27 ENCOUNTER — Emergency Department
Admission: EM | Admit: 2017-10-27 | Discharge: 2017-10-27 | Disposition: A | Discharge status: Home / Self Care | Payer: Medicare Other | Attending: Emergency Medicine | Admitting: Emergency Medicine

## 2017-10-27 ENCOUNTER — Other Ambulatory Visit: Payer: Self-pay

## 2017-10-27 DIAGNOSIS — J0110 Acute frontal sinusitis, unspecified: Secondary | ICD-10-CM

## 2017-10-27 DIAGNOSIS — J45909 Unspecified asthma, uncomplicated: Secondary | ICD-10-CM | POA: Diagnosis not present

## 2017-10-27 DIAGNOSIS — E119 Type 2 diabetes mellitus without complications: Secondary | ICD-10-CM | POA: Insufficient documentation

## 2017-10-27 DIAGNOSIS — H9201 Otalgia, right ear: Secondary | ICD-10-CM | POA: Diagnosis present

## 2017-10-27 DIAGNOSIS — Z79899 Other long term (current) drug therapy: Secondary | ICD-10-CM | POA: Insufficient documentation

## 2017-10-27 DIAGNOSIS — J019 Acute sinusitis, unspecified: Secondary | ICD-10-CM | POA: Insufficient documentation

## 2017-10-27 DIAGNOSIS — I10 Essential (primary) hypertension: Secondary | ICD-10-CM | POA: Insufficient documentation

## 2017-10-27 MED ORDER — LEVOFLOXACIN 500 MG PO TABS: 500.0000 mg | ORAL_TABLET | Freq: Every day | ORAL | 0 refills | Status: AC

## 2017-10-27 NOTE — Discharge Instructions (Signed)
Please do not take glipizide while you are taking Levaquin.  It is extremely dangerous and will cause your blood sugar to drop.  Please continue all of your sinus medications as prescribed and follow-up with your ear nose and throat specialist for reevaluation.  It was a pleasure to take care of you today, and thank you for coming to our emergency department.  If you have any questions or concerns before leaving please ask the nurse to grab me and I'm more than happy to go through your aftercare instructions again.  If you were prescribed any opioid pain medication today such as Norco, Vicodin, Percocet, morphine, hydrocodone, or oxycodone please make sure you do not drive when you are taking this medication as it can alter your ability to drive safely.  If you have any concerns once you are home that you are not improving or are in fact getting worse before you can make it to your follow-up appointment, please do not hesitate to call 911 and come back for further evaluation.  [name], MD

## 2017-10-27 NOTE — ED Provider Notes (Signed)
[facility] Emergency Department Provider Note  ____________________________________________   First MD Initiated Contact with Patient 10/27/17 [phone]     (approximate)  I have reviewed the triage vital signs and the nursing notes.   HISTORY  Chief Complaint Otalgia and Headache   HPI Jason Doyle is a 72 y.o. male who self presents to the emergency department with right earache and frontal sinus congestion causing headache for the past 3 or 4 days.  He has a long-standing history of chronic sinusitis and performs nasal rinses twice a day.  His symptoms become acutely worse over the past 3 days.  His pain is described as pressure and "fullness".  No fevers or chills.  No recent antibiotics.  His pain is worse when lying flat and improved with sitting up.  No neck pain.  No numbness or weakness.    Past Medical History:  Diagnosis Date  . Ankylosing spondylitis ([name])   . Arthritis    knee,hands,back and neck  . Asthma    LAST ATTACK 4-5 YRS AGO  . BPH (benign prostatic hyperplasia)   . 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 Gout   . H/O multiple allergies    SEASONAL,ENVIRONMENTAL  . H/O sinus surgery    x4  . Headache    sinus  . Hypertension   . Rotator cuff tear    left shoulder  . Sleep apnea    C-PAP    There are no active problems to display for this patient.   Past Surgical History:  Procedure Laterality Date  . BICEPT TENODESIS Left 07/23/2014   Procedure: BICEPS TENODESIS;  Surgeon: [name], MD;  Location: [place];  Service: Orthopedics;  Laterality: Left;  . COLONOSCOPY WITH PROPOFOL N/A 04/10/2017   Procedure: COLONOSCOPY WITH PROPOFOL;  Surgeon: [name], MD;  Location: [facility] ENDOSCOPY;  Service: Endoscopy;  Laterality: N/A;  . KNEE ARTHROSCOPY    . NASAL SINUS SURGERY     x 4  . SHOULDER ARTHROSCOPY WITH ROTATOR CUFF REPAIR AND SUBACROMIAL DECOMPRESSION Left 07/23/2014   Procedure:  SHOULDER ARTHROSCOPY WITH ROTATOR CUFF REPAIR AND SUBACROMIAL DECOMPRESSION;  Surgeon: [name], MD;  Location: [place];  Service: Orthopedics;  Laterality: Left;  . UVULOPALATOPHARYNGOPLASTY      Prior to Admission medications   Medication Sig Start Date End Date Taking? Authorizing Provider  albuterol (PROVENTIL HFA;VENTOLIN HFA) 108 (90 BASE) MCG/ACT inhaler Inhale 2 puffs into the lungs as needed for wheezing or shortness of breath.    [provider]  allopurinol (ZYLOPRIM) 100 MG tablet Take 100 mg by mouth daily. am    [provider]  amLODipine (NORVASC) 5 MG tablet Take 5 mg by mouth daily. am    [provider]  atorvastatin (LIPITOR) 80 MG tablet Take 80 mg by mouth daily.    [provider]  budesonide-formoterol (SYMBICORT) 160-4.5 MCG/ACT inhaler Inhale 2 puffs into the lungs daily. am    [provider]  Cholecalciferol (VITAMIN D) 2000 UNITS tablet Take 2,000 Units by mouth daily. am    [provider]  etanercept (ENBREL) 50 MG/ML injection Inject 50 mg into the skin once a week.    [provider]  levofloxacin (LEVAQUIN) 500 MG tablet Take 1 tablet (500 mg total) by mouth daily for 5 days. 10/27/17 11/01/17  [name], MD  lisinopril (PRINIVIL,ZESTRIL) 40 MG tablet Take 40 mg by mouth daily.  [provider]  loratadine (CLARITIN) 10 MG tablet Take 10 mg by mouth daily.    [provider]  metFORMIN (GLUCOPHAGE) 500 MG tablet Take by mouth 2 (two) times daily with a meal.    [provider]  montelukast (SINGULAIR) 10 MG tablet Take 10 mg by mouth at bedtime.    [provider]  tamsulosin (FLOMAX) 0.4 MG CAPS capsule Take 0.4 mg by mouth daily after breakfast.    [provider]  tiZANidine (ZANAFLEX) 4 MG capsule Take 4 mg by mouth daily. Pm    [provider]  valsartan (DIOVAN) 320 MG tablet Take 320 mg by mouth daily. Am    [provider]    Allergies Patient has no known allergies.  No family history on file.  Social History Social History   Tobacco Use  . Smoking status: Never Smoker  . Smokeless tobacco: Never Used  Substance Use Topics  . Alcohol use: Yes    Comment: RARELY  . Drug use: Not on file    Review of Systems Constitutional: No fever/chills Eyes: No visual changes. ENT: Positive for otalgia Cardiovascular: Denies chest pain. Respiratory: Denies shortness of breath. Gastrointestinal: No abdominal pain.  No nausea, no vomiting.  No diarrhea.  No constipation. Genitourinary: Negative for dysuria. Musculoskeletal: Negative for back pain. Skin: Negative for rash. Neurological: Positive for headache   ____________________________________________   PHYSICAL EXAM:  VITAL SIGNS: ED Triage Vitals [10/27/17 0332]  Enc Vitals Group     BP (!) 172/83     Pulse Rate 70     Resp 18     Temp 97.7 F (36.5 C)     Temp Source Oral     SpO2 97 %     Weight 250 lb (113.4 kg)     Height 6\' 2"  (1.88 m)     Head Circumference      Peak Flow      Pain Score 8     Pain Loc      Pain Edu?      Excl. in [place]?     Constitutional: Alert and oriented x4 appears uncomfortable nontoxic no diaphoresis speaks full clear sentences Eyes: PERRL EOMI. Head: Atraumatic.  Tenderness over frontal sinuses.  Normal tympanic membrane's bilaterally.  No mastoid tenderness Nose: No congestion/rhinnorhea. Mouth/Throat: No trismus Neck: No stridor.  No meningismus Cardiovascular: Normal rate, regular rhythm. Grossly normal heart sounds.  Good peripheral circulation. Respiratory: Normal respiratory effort.  No retractions. Lungs CTAB and moving good air Gastrointestinal: Soft nontender Musculoskeletal: No lower extremity edema   Neurologic:  Normal speech and language. No gross focal neurologic deficits are appreciated. Skin:  Skin is warm, dry and intact. No rash noted. Psychiatric: Mood and affect are  normal. Speech and behavior are normal.    ____________________________________________   DIFFERENTIAL includes but not limited to  Sinus congestion, sinusitis, otitis media, dental infection ____________________________________________   LABS (all labs ordered are listed, but only abnormal results are displayed)  Labs Reviewed - No data to display   __________________________________________  EKG   ____________________________________________  RADIOLOGY   ____________________________________________   PROCEDURES  Procedure(s) performed: no  Procedures  Critical Care performed: no  ____________________________________________   INITIAL IMPRESSION / ASSESSMENT AND PLAN / ED COURSE  Pertinent labs & imaging results that were available during my care of the patient were reviewed by me and considered in my medical decision making (see chart for details).   As part of my medical  decision making, I reviewed the following data within the [place] History obtained from family if available, nursing notes, old chart and ekg, as well as notes from prior ED visits.  The patient comes to the emergency department uncomfortable appearing with right frontal sinus congestion and right otalgia.  No evidence of otitis media and no evidence of dental pain.  Given the severity of his symptoms I do think is reasonable to cover him with Augmentin however the patient says that "the only antibiotic that ever seems to work his Levaquin".  I discussed with the patient that this medication can have a lot of drug drug interactions however he is comfortable taking a 5-day course which I think is actually reasonable.  We discharged home with ENT follow-up as scheduled.      ____________________________________________   FINAL CLINICAL IMPRESSION(S) / ED DIAGNOSES  Final diagnoses:  Right ear pain  Subacute frontal sinusitis      NEW MEDICATIONS STARTED DURING THIS  VISIT:  Discharge Medication List as of 10/27/2017  5:57 AM    START taking these medications   Details  levofloxacin (LEVAQUIN) 500 MG tablet Take 1 tablet (500 mg total) by mouth daily for 5 days., Starting Fri 10/27/2017, Until Wed 11/01/2017, Print         Note:  This document was prepared using Dragon voice recognition software and may include unintentional dictation errors.     [name], MD 10/29/17 [phone]

## 2017-10-27 NOTE — ED Triage Notes (Signed)
Pt to triage via w/c with no distress noted; pt reports right earache, now causing pain to right side of head accomp by nausea x 3-4 days; st recent sinus congestion (st does sinus rinse twice daily)

## 2017-11-01 ENCOUNTER — Other Ambulatory Visit: Payer: Self-pay | Admitting: Physician Assistant

## 2017-11-01 DIAGNOSIS — H9201 Otalgia, right ear: Secondary | ICD-10-CM

## 2017-11-03 ENCOUNTER — Other Ambulatory Visit
Admission: RE | Admit: 2017-11-03 | Discharge: 2017-11-03 | Disposition: A | Discharge status: Home / Self Care | Payer: Medicare Other | Source: Ambulatory Visit | Attending: Physician Assistant | Admitting: Physician Assistant

## 2017-11-03 ENCOUNTER — Ambulatory Visit
Admission: RE | Admit: 2017-11-03 | Discharge: 2017-11-03 | Disposition: A | Discharge status: Home / Self Care | Payer: Medicare Other | Source: Ambulatory Visit | Attending: Physician Assistant | Admitting: Physician Assistant

## 2017-11-03 DIAGNOSIS — H9201 Otalgia, right ear: Secondary | ICD-10-CM | POA: Insufficient documentation

## 2017-11-03 LAB — BUN: BUN: 18 mg/dL (ref 8–23)

## 2017-11-03 LAB — CREATININE, SERUM
CREATININE: 1.34 mg/dL — AB (ref 0.61–1.24)
GFR calc non Af Amer: 52 mL/min — ABNORMAL LOW (ref >60)
GFR, EST AFRICAN AMERICAN: 60 mL/min — AB (ref >60)

## 2017-11-03 MED ORDER — IOPAMIDOL (ISOVUE-300) INJECTION 61%
75.0000 mL | Freq: Once | INTRAVENOUS | Status: AC | PRN
  Administered 2017-11-03: 75 mL via INTRAVENOUS

## 2018-05-03 ENCOUNTER — Ambulatory Visit: Payer: Medicare Other | Attending: Otolaryngology

## 2018-05-03 DIAGNOSIS — G4733 Obstructive sleep apnea (adult) (pediatric): Secondary | ICD-10-CM | POA: Insufficient documentation

## 2018-07-06 ENCOUNTER — Other Ambulatory Visit: Payer: Medicare Other

## 2018-07-06 ENCOUNTER — Telehealth: Payer: Self-pay | Admitting: *Deleted

## 2018-07-06 DIAGNOSIS — Z20822 Contact with and (suspected) exposure to covid-19: Secondary | ICD-10-CM

## 2018-07-06 NOTE — Telephone Encounter (Signed)
Provider Dr Pryor Ochoa called to schedule a covid 19 testing for this patient.  Pt called and was scheduled a test for covid 19 for today 07/06/2018, at the [facility] in [place]. Appointment scheduled for 3:30. Pt advised to wear a mask, stay in the care with windows rolled up until time for his test. This is a drive through test site. Pt voiced understanding.

## 2018-07-09 LAB — NOVEL CORONAVIRUS, NAA: SARS-CoV-2, NAA: NOT DETECTED

## 2018-08-20 ENCOUNTER — Ambulatory Visit
Admission: RE | Admit: 2018-08-20 | Discharge: 2018-08-20 | Disposition: A | Discharge status: Home / Self Care | Payer: Medicare Other | Source: Ambulatory Visit | Attending: Family Medicine | Admitting: Family Medicine

## 2018-08-20 ENCOUNTER — Other Ambulatory Visit: Payer: Self-pay

## 2018-08-20 ENCOUNTER — Other Ambulatory Visit: Payer: Self-pay | Admitting: Family Medicine

## 2018-08-20 ENCOUNTER — Inpatient Hospital Stay
Admission: EM | Admit: 2018-08-20 | Discharge: 2018-08-21 | DRG: 064 | Disposition: A | Discharge status: Home Health | Payer: Medicare Other | Attending: Internal Medicine | Admitting: Internal Medicine

## 2018-08-20 DIAGNOSIS — G4733 Obstructive sleep apnea (adult) (pediatric): Secondary | ICD-10-CM | POA: Diagnosis present

## 2018-08-20 DIAGNOSIS — K760 Fatty (change of) liver, not elsewhere classified: Secondary | ICD-10-CM | POA: Diagnosis present

## 2018-08-20 DIAGNOSIS — N400 Enlarged prostate without lower urinary tract symptoms: Secondary | ICD-10-CM | POA: Diagnosis present

## 2018-08-20 DIAGNOSIS — Z7984 Long term (current) use of oral hypoglycemic drugs: Secondary | ICD-10-CM | POA: Diagnosis not present

## 2018-08-20 DIAGNOSIS — M109 Gout, unspecified: Secondary | ICD-10-CM | POA: Diagnosis present

## 2018-08-20 DIAGNOSIS — I618 Other nontraumatic intracerebral hemorrhage: Secondary | ICD-10-CM | POA: Diagnosis present

## 2018-08-20 DIAGNOSIS — Z79899 Other long term (current) drug therapy: Secondary | ICD-10-CM | POA: Diagnosis not present

## 2018-08-20 DIAGNOSIS — Z7951 Long term (current) use of inhaled steroids: Secondary | ICD-10-CM | POA: Diagnosis not present

## 2018-08-20 DIAGNOSIS — E785 Hyperlipidemia, unspecified: Secondary | ICD-10-CM | POA: Diagnosis present

## 2018-08-20 DIAGNOSIS — E1151 Type 2 diabetes mellitus with diabetic peripheral angiopathy without gangrene: Secondary | ICD-10-CM | POA: Diagnosis present

## 2018-08-20 DIAGNOSIS — M459 Ankylosing spondylitis of unspecified sites in spine: Secondary | ICD-10-CM | POA: Diagnosis present

## 2018-08-20 DIAGNOSIS — R4182 Altered mental status, unspecified: Secondary | ICD-10-CM

## 2018-08-20 DIAGNOSIS — I10 Essential (primary) hypertension: Secondary | ICD-10-CM | POA: Diagnosis present

## 2018-08-20 DIAGNOSIS — I639 Cerebral infarction, unspecified: Principal | ICD-10-CM | POA: Diagnosis present

## 2018-08-20 DIAGNOSIS — Z20828 Contact with and (suspected) exposure to other viral communicable diseases: Secondary | ICD-10-CM | POA: Diagnosis present

## 2018-08-20 DIAGNOSIS — Z86010 Personal history of colonic polyps: Secondary | ICD-10-CM

## 2018-08-20 DIAGNOSIS — J302 Other seasonal allergic rhinitis: Secondary | ICD-10-CM | POA: Diagnosis present

## 2018-08-20 DIAGNOSIS — Z7983 Long term (current) use of bisphosphonates: Secondary | ICD-10-CM | POA: Diagnosis not present

## 2018-08-20 LAB — COMPREHENSIVE METABOLIC PANEL
ALT: 54 U/L — ABNORMAL HIGH (ref 0–44)
AST: 56 U/L — ABNORMAL HIGH (ref 15–41)
Albumin: 4.3 g/dL (ref 3.5–5.0)
Alkaline Phosphatase: 156 U/L — ABNORMAL HIGH (ref 38–126)
Anion gap: 7 (ref 5–15)
BUN: 18 mg/dL (ref 8–23)
CO2: 29 mmol/L (ref 22–32)
Calcium: 9.1 mg/dL (ref 8.9–10.3)
Chloride: 106 mmol/L (ref 98–111)
Creatinine, Ser: 1.2 mg/dL (ref 0.61–1.24)
GFR calc Af Amer: >60 mL/min (ref >60)
GFR calc non Af Amer: >60 mL/min (ref >60)
Glucose, Bld: 151 mg/dL — ABNORMAL HIGH (ref 70–99)
Potassium: 4.1 mmol/L (ref 3.5–5.1)
Sodium: 142 mmol/L (ref 135–145)
Total Bilirubin: 0.7 mg/dL (ref 0.3–1.2)
Total Protein: 7.5 g/dL (ref 6.5–8.1)

## 2018-08-20 LAB — DIFFERENTIAL
Abs Immature Granulocytes: 0.02 10*3/uL (ref 0.00–0.07)
Basophils Absolute: 0 10*3/uL (ref 0.0–0.1)
Basophils Relative: 0 %
Eosinophils Absolute: 0.1 10*3/uL (ref 0.0–0.5)
Eosinophils Relative: 1 %
Immature Granulocytes: 0 %
Lymphocytes Relative: 50 %
Lymphs Abs: 4.3 10*3/uL — ABNORMAL HIGH (ref 0.7–4.0)
Monocytes Absolute: 0.8 10*3/uL (ref 0.1–1.0)
Monocytes Relative: 9 %
Neutro Abs: 3.5 10*3/uL (ref 1.7–7.7)
Neutrophils Relative %: 40 %

## 2018-08-20 LAB — CBC
HCT: 51.7 % (ref 39.0–52.0)
Hemoglobin: 16.7 g/dL (ref 13.0–17.0)
MCH: 29.3 pg (ref 26.0–34.0)
MCHC: 32.3 g/dL (ref 30.0–36.0)
MCV: 90.9 fL (ref 80.0–100.0)
Platelets: 161 10*3/uL (ref 150–400)
RBC: 5.69 MIL/uL (ref 4.22–5.81)
RDW: 13.8 % (ref 11.5–15.5)
WBC: 8.7 10*3/uL (ref 4.0–10.5)
nRBC: 0 % (ref 0.0–0.2)

## 2018-08-20 LAB — PROTIME-INR
INR: 1.1 (ref 0.8–1.2)
Prothrombin Time: 13.7 seconds (ref 11.4–15.2)

## 2018-08-20 LAB — GLUCOSE, CAPILLARY: Glucose-Capillary: 161 mg/dL — ABNORMAL HIGH (ref 70–99)

## 2018-08-20 LAB — SARS CORONAVIRUS 2 BY RT PCR (HOSPITAL ORDER, PERFORMED IN ~~LOC~~ HOSPITAL LAB): SARS Coronavirus 2: NEGATIVE

## 2018-08-20 LAB — APTT: aPTT: 29 seconds (ref 24–36)

## 2018-08-20 MED ORDER — LABETALOL HCL 5 MG/ML IV SOLN
10.0000 mg | INTRAVENOUS | Status: DC | PRN
  Administered 2018-08-20: 19:00:00 10 mg via INTRAVENOUS
  Filled 2018-08-20: qty 4

## 2018-08-20 MED ORDER — ASPIRIN 81 MG PO CHEW: 324.0000 mg | CHEWABLE_TABLET | Freq: Once | ORAL | Status: DC

## 2018-08-20 MED ORDER — LORATADINE 10 MG PO TABS
10.0000 mg | ORAL_TABLET | Freq: Every day | ORAL | Status: DC
  Administered 2018-08-21: 10:00:00 10 mg via ORAL
  Filled 2018-08-20: qty 1

## 2018-08-20 MED ORDER — ACETAMINOPHEN 650 MG RE SUPP: 650.0000 mg | RECTAL | Status: DC | PRN

## 2018-08-20 MED ORDER — ACETAMINOPHEN 160 MG/5ML PO SOLN
650.0000 mg | ORAL | Status: DC | PRN
  Filled 2018-08-20: qty 20.3

## 2018-08-20 MED ORDER — ALLOPURINOL 100 MG PO TABS
100.0000 mg | ORAL_TABLET | Freq: Every day | ORAL | Status: DC
  Administered 2018-08-21: 100 mg via ORAL
  Filled 2018-08-20: qty 1

## 2018-08-20 MED ORDER — MOMETASONE FURO-FORMOTEROL FUM 200-5 MCG/ACT IN AERO
2.0000 | INHALATION_SPRAY | Freq: Two times a day (BID) | RESPIRATORY_TRACT | Status: DC
  Administered 2018-08-20 – 2018-08-21 (×2): 2 via RESPIRATORY_TRACT
  Filled 2018-08-20: qty 8.8

## 2018-08-20 MED ORDER — ALBUTEROL SULFATE (2.5 MG/3ML) 0.083% IN NEBU: 2.5000 mg | INHALATION_SOLUTION | RESPIRATORY_TRACT | Status: DC | PRN

## 2018-08-20 MED ORDER — TIZANIDINE HCL 4 MG PO TABS
4.0000 mg | ORAL_TABLET | Freq: Every day | ORAL | Status: DC
  Administered 2018-08-21: 4 mg via ORAL
  Filled 2018-08-20: qty 1

## 2018-08-20 MED ORDER — ATORVASTATIN CALCIUM 20 MG PO TABS
80.0000 mg | ORAL_TABLET | Freq: Every day | ORAL | Status: DC
  Administered 2018-08-20: 22:00:00 80 mg via ORAL
  Filled 2018-08-20: qty 4

## 2018-08-20 MED ORDER — LISINOPRIL 20 MG PO TABS
40.0000 mg | ORAL_TABLET | Freq: Every day | ORAL | Status: DC
  Administered 2018-08-21: 40 mg via ORAL
  Filled 2018-08-20: qty 2
  Filled 2018-08-20: qty 4
  Filled 2018-08-20: qty 2

## 2018-08-20 MED ORDER — ACETAMINOPHEN 325 MG PO TABS: 650.0000 mg | ORAL_TABLET | ORAL | Status: DC | PRN

## 2018-08-20 MED ORDER — SENNOSIDES-DOCUSATE SODIUM 8.6-50 MG PO TABS: 1.0000 | ORAL_TABLET | Freq: Every evening | ORAL | Status: DC | PRN

## 2018-08-20 MED ORDER — SODIUM CHLORIDE 0.9 % IV SOLN
INTRAVENOUS | Status: DC
  Administered 2018-08-20: 23:00:00 via INTRAVENOUS

## 2018-08-20 MED ORDER — ETANERCEPT 50 MG/ML ~~LOC~~ SOSY: 50.0000 mg | PREFILLED_SYRINGE | SUBCUTANEOUS | Status: DC

## 2018-08-20 MED ORDER — SODIUM CHLORIDE 0.9% FLUSH
3.0000 mL | Freq: Once | INTRAVENOUS | Status: AC
  Administered 2018-08-20: 3 mL via INTRAVENOUS

## 2018-08-20 MED ORDER — VITAMIN D 25 MCG (1000 UNIT) PO TABS
2000.0000 [IU] | ORAL_TABLET | Freq: Every day | ORAL | Status: DC
  Administered 2018-08-21: 10:00:00 2000 [IU] via ORAL
  Filled 2018-08-20: qty 2

## 2018-08-20 MED ORDER — STROKE: EARLY STAGES OF RECOVERY BOOK
Freq: Once | Status: AC
  Administered 2018-08-20: 22:00:00

## 2018-08-20 MED ORDER — MONTELUKAST SODIUM 10 MG PO TABS
10.0000 mg | ORAL_TABLET | Freq: Every day | ORAL | Status: DC
  Administered 2018-08-20: 22:00:00 10 mg via ORAL
  Filled 2018-08-20: qty 1

## 2018-08-20 MED ORDER — TAMSULOSIN HCL 0.4 MG PO CAPS
0.4000 mg | ORAL_CAPSULE | Freq: Every day | ORAL | Status: DC
  Administered 2018-08-21: 10:00:00 0.4 mg via ORAL
  Filled 2018-08-20: qty 1

## 2018-08-20 MED ORDER — AMLODIPINE BESYLATE 5 MG PO TABS
5.0000 mg | ORAL_TABLET | Freq: Every day | ORAL | Status: DC
  Administered 2018-08-21: 10:00:00 5 mg via ORAL
  Filled 2018-08-20 (×2): qty 1

## 2018-08-20 NOTE — ED Provider Notes (Signed)
[facility] Emergency Department Provider Note       Time seen: ----------------------------------------- 3:45 PM on 08/20/2018 -----------------------------------------   I have reviewed the triage vital signs and the nursing notes.  HISTORY   Chief Complaint Cerebrovascular Accident    HPI Jason Doyle is a 73 y.o. male with a history of ankylosing spondylitis, arthritis, asthma, diabetes, hypertension who presents to the ED for disorientation.  Patient reports feeling like he was in a tunnel on Friday and was disoriented.  States he got a little better but was not cleared up until he saw his primary care doctor who sent him for an MRI.  He reports memory disturbance and forgetfulness but otherwise has not had any other specific neurologic complaint.  He had an outpatient MRI which was positive for CVA and was sent here for further evaluation.  Past Medical History:  Diagnosis Date  . Ankylosing spondylitis ([place])   . Arthritis    knee,hands,back and neck  . Asthma    LAST ATTACK 4-5 YRS AGO  . BPH (benign prostatic hyperplasia)   . Diabetes mellitus without complication ([place])    TYPE 2  . Gout   . H/O multiple allergies    SEASONAL,ENVIRONMENTAL  . H/O sinus surgery    x4  . Headache    sinus  . Hypertension   . Rotator cuff tear    left shoulder  . Sleep apnea    C-PAP    There are no active problems to display for this patient.   Past Surgical History:  Procedure Laterality Date  . BICEPT TENODESIS Left 07/23/2014   Procedure: BICEPS TENODESIS;  Surgeon: [name], MD;  Location: [place];  Service: Orthopedics;  Laterality: Left;  . COLONOSCOPY WITH PROPOFOL N/A 04/10/2017   Procedure: COLONOSCOPY WITH PROPOFOL;  Surgeon: [name], MD;  Location: [facility] ENDOSCOPY;  Service: Endoscopy;  Laterality: N/A;  . KNEE ARTHROSCOPY    . NASAL SINUS SURGERY     x 4  . SHOULDER ARTHROSCOPY WITH ROTATOR CUFF REPAIR AND  SUBACROMIAL DECOMPRESSION Left 07/23/2014   Procedure: SHOULDER ARTHROSCOPY WITH ROTATOR CUFF REPAIR AND SUBACROMIAL DECOMPRESSION;  Surgeon: [name], MD;  Location: [place];  Service: Orthopedics;  Laterality: Left;  . UVULOPALATOPHARYNGOPLASTY      Allergies Patient has no known allergies.  Social History Social History   Tobacco Use  . Smoking status: Never Smoker  . Smokeless tobacco: Never Used  Substance Use Topics  . Alcohol use: Yes    Comment: RARELY  . Drug use: Not on file   Review of Systems Constitutional: Negative for fever. Cardiovascular: Negative for chest pain. Respiratory: Negative for shortness of breath. Gastrointestinal: Negative for abdominal pain, vomiting and diarrhea. Musculoskeletal: Negative for back pain. Skin: Negative for rash. Neurological: Positive for confusion and disorientation  All systems negative/normal/unremarkable except as stated in the HPI  ____________________________________________   PHYSICAL EXAM:  VITAL SIGNS: ED Triage Vitals  Enc Vitals Group     BP 08/20/18 1445 (!) 178/109     Pulse Rate 08/20/18 1445 86     Resp 08/20/18 1445 18     Temp 08/20/18 1445 98.6 F (37 C)     Temp Source 08/20/18 1445 Oral     SpO2 08/20/18 1445 97 %     Weight 08/20/18 1446 255 lb (115.7 kg)     Height 08/20/18 1446 6\' 2"  (1.88 m)     Head Circumference --  Peak Flow --      Pain Score 08/20/18 1446 0     Pain Loc --      Pain Edu? --      Excl. in [place]? --    Constitutional: Alert and oriented. Well appearing and in no distress. Eyes: Conjunctivae are normal. Normal extraocular movements. ENT      Head: Normocephalic and atraumatic.      Nose: No congestion/rhinnorhea.      Mouth/Throat: Mucous membranes are moist.      Neck: No stridor. Cardiovascular: Normal rate, regular rhythm. No murmurs, rubs, or gallops. Respiratory: Normal respiratory effort without tachypnea nor retractions. Breath sounds are clear  and equal bilaterally. No wheezes/rales/rhonchi. Gastrointestinal: Soft and nontender. Normal bowel sounds Musculoskeletal: Nontender with normal range of motion in extremities. No lower extremity tenderness nor edema. Neurologic:  Normal speech and language. No gross focal neurologic deficits are appreciated.  Strength, sensation, cranial nerves appear to be normal Skin:  Skin is warm, dry and intact. No rash noted. Psychiatric: Mood and affect are normal. Speech and behavior are normal.  ____________________________________________  ED COURSE:  As part of my medical decision making, I reviewed the following data within the [place] History obtained from family if available, nursing notes, old chart and ekg, as well as notes from prior ED visits. Patient presented for strokelike symptoms that started on Friday or Saturday, we will assess with labs and imaging as indicated at this time.   Procedures  Jason Doyle was evaluated in Emergency Department on 08/20/2018 for the symptoms described in the history of present illness. He was evaluated in the context of the global COVID-19 pandemic, which necessitated consideration that the patient might be at risk for infection with the SARS-CoV-2 virus that causes COVID-19. Institutional protocols and algorithms that pertain to the evaluation of patients at risk for COVID-19 are in a state of rapid change based on information released by regulatory bodies including the CDC and federal and state organizations. These policies and algorithms were followed during the patient's care in the ED.  ____________________________________________   LABS (pertinent positives/negatives)  Labs Reviewed  DIFFERENTIAL - Abnormal; Notable for the following components:      Result Value   Lymphs Abs 4.3 (*)    All other components within normal limits  COMPREHENSIVE METABOLIC PANEL - Abnormal; Notable for the following components:   Glucose, Bld 151  (*)    AST 56 (*)    ALT 54 (*)    Alkaline Phosphatase 156 (*)    All other components within normal limits  GLUCOSE, CAPILLARY - Abnormal; Notable for the following components:   Glucose-Capillary 161 (*)    All other components within normal limits  SARS CORONAVIRUS 2 (HOSPITAL ORDER, [facility])  PROTIME-INR  APTT  CBC  CBG MONITORING, ED  I-STAT CREATININE, ED   CRITICAL CARE Performed by: Laurence Aly   Total critical care time: 30 minutes  Critical care time was exclusive of separately billable procedures and treating other patients.  Critical care was necessary to treat or prevent imminent or life-threatening deterioration.  Critical care was time spent personally by me on the following activities: development of treatment plan with patient and/or surrogate as well as nursing, discussions with consultants, evaluation of patient's response to treatment, examination of patient, obtaining history from patient or surrogate, ordering and performing treatments and interventions, ordering and review of laboratory studies, ordering and review of radiographic studies,  pulse oximetry and re-evaluation of patient's condition.  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 Mild swelling. Some petechial blood products, particularly along the posterior areas of involvement. No frank hematoma. No mass effect.  Mild chronic small-vessel ischemic change elsewhere.  ____________________________________________   DIFFERENTIAL DIAGNOSIS   CVA, TIA, dehydration, electrolyte abnormality  FINAL ASSESSMENT AND PLAN  CVA   Plan: The patient had presented for CVA symptoms that started 48 to 72 hours ago. Patient's labs were overall reassuring. Patient's imaging did reveal acute infarction in the right parietal lobe.  Clinically he does not  have any neurologic deficits and is only just described confusion, disorientation or memory disturbance.  I have discussed with neurology on-call.  We will hold aspirin given some petechial blood products seen on MRI.  He is stable for admission at this time.   Laurence Aly, MD    Note: This note was generated in part or whole with voice recognition software. Voice recognition is usually quite accurate but there are transcription errors that can and very often do occur. I apologize for any typographical errors that were not detected and corrected.     Earleen Newport, MD 08/20/18 [phone]

## 2018-08-20 NOTE — ED Notes (Signed)
ED Provider at bedside. 

## 2018-08-20 NOTE — ED Triage Notes (Signed)
Pt c/o feeling like he was in a tunnel on Friday, disoriented, states he got a little better but it has not cleared up and today saw PCP who sent him to MRI and pt was brought straight from MRI to the ED due to positive MRI.

## 2018-08-20 NOTE — H&P (Addendum)
Jason Doyle at [facility] NAME: Jason Doyle    MR#:  [id number]  DATE OF BIRTH:  Aug 03, 1945  DATE OF ADMISSION:  08/20/2018  PRIMARY CARE PHYSICIAN: Adin Hector, MD   REQUESTING/REFERRING PHYSICIAN: Lenise Arena, MD  CHIEF COMPLAINT:   Chief Complaint  Patient presents with  . Cerebrovascular Accident    HISTORY OF PRESENT ILLNESS:   73 year old male with history of benign neoplasm of colon, type 2 diabetes mellitus, hyperlipidemia, ankylosing spondylitis, hypertension, asthma, and BPH presenting to the ED with complaints of memory loss, confusion and disorientation since 08/17/2018.  Patient reports onset of symptoms when he woke up Friday morning felt like he was "having out of body experience".  Patient states he was able to do his daily chores throughout the day however he seemed confused with visual spatial disorientation. Family members reported that he was forgetting "usual task" for example he thought he grilled the chicken but they were still in the refrigerator. Denies associated speech abnormality, cranial nerve deficit, seizures, focal motor or sensory deficits, diplopia, nausea or vomiting, headache, dizziness, ipsilateral or contralateral paralysis/weakness, numbness or tingling, involuntary movements, tremor. He denies history of head injury or trauma.  He saw his PCP who sent him for MRI of the brain, due to abnormal finding on MRI showing acute infarction in the right parietal lobe patient was sent to the ED for further evaluation.  On arrival to the ED, he was afebrile with blood pressure 178/109 mm Hg and pulse rate 109 beats/min. There were no focal neurological deficits; he was alert and oriented x4, and he did not demonstrate any memory deficits. Complete blood count (CBC) unremarkable, comprehensive metabolic panel (CMP)  showed elevated AST 56 and ALT 54, alkphos 156. He has remote hx of fatty liver disease. ECG showed  sinus rhythm of 51 beats per minute.  The patient has remained free from recurrent events and maintains a secondary prevention medication regimen including 81mg  of aspirin and 80mg  of atorvastatin.  He will be admitted under hospitalist service for stroke work-up and management.  PAST MEDICAL HISTORY:   Past Medical History:  Diagnosis Date  . Ankylosing spondylitis ([place])   . Arthritis    knee,hands,back and neck  . Asthma    LAST ATTACK 4-5 YRS AGO  . BPH (benign prostatic hyperplasia)   . Diabetes mellitus without complication ([place])    TYPE 2  . Gout   . H/O multiple allergies    SEASONAL,ENVIRONMENTAL  . H/O sinus surgery    x4  . Headache    sinus  . Hypertension   . Rotator cuff tear    left shoulder  . Sleep apnea    C-PAP    PAST SURGICAL HISTORY:   Past Surgical History:  Procedure Laterality Date  . BICEPT TENODESIS Left 07/23/2014   Procedure: BICEPS TENODESIS;  Surgeon: [name], MD;  Location: [place];  Service: Orthopedics;  Laterality: Left;  . COLONOSCOPY WITH PROPOFOL N/A 04/10/2017   Procedure: COLONOSCOPY WITH PROPOFOL;  Surgeon: [name], MD;  Location: [facility] ENDOSCOPY;  Service: Endoscopy;  Laterality: N/A;  . KNEE ARTHROSCOPY    . NASAL SINUS SURGERY     x 4  . SHOULDER ARTHROSCOPY WITH ROTATOR CUFF REPAIR AND SUBACROMIAL DECOMPRESSION Left 07/23/2014   Procedure: SHOULDER ARTHROSCOPY WITH ROTATOR CUFF REPAIR AND SUBACROMIAL DECOMPRESSION;  Surgeon: [name], MD;  Location: [place];  Service: Orthopedics;  Laterality: Left;  .  UVULOPALATOPHARYNGOPLASTY      SOCIAL HISTORY:   Social History   Tobacco Use  . Smoking status: Never Smoker  . Smokeless tobacco: Never Used  Substance Use Topics  . Alcohol use: Yes    Comment: RARELY    FAMILY HISTORY:  No family history on file.  DRUG ALLERGIES:  No Known Allergies  REVIEW OF SYSTEMS:   Review of Systems  Constitutional: Negative for chills, fever,  malaise/fatigue and weight loss.  HENT: Negative for congestion, hearing loss and sore throat.   Eyes: Negative for blurred vision and double vision.  Respiratory: Negative for cough, shortness of breath and wheezing.   Cardiovascular: Positive for orthopnea. Negative for chest pain, palpitations and leg swelling.  Gastrointestinal: Negative for abdominal pain, diarrhea, nausea and vomiting.  Genitourinary: Negative for dysuria and urgency.  Musculoskeletal: Positive for back pain and joint pain. Negative for myalgias.  Skin: Negative for rash.  Neurological: Negative for dizziness, sensory change, speech change, focal weakness and headaches.       Confusion and disorientation  Psychiatric/Behavioral: Negative for depression.   MEDICATIONS AT HOME:   Prior to Admission medications   Medication Sig Start Date End Date Taking? Authorizing Provider  allopurinol (ZYLOPRIM) 100 MG tablet Take 100 mg by mouth daily. am   Yes [provider]  amLODipine (NORVASC) 5 MG tablet Take 5 mg by mouth daily. am   Yes [provider]  atorvastatin (LIPITOR) 80 MG tablet Take 80 mg by mouth daily.   Yes [provider]  Azelastine-Fluticasone 137-50 MCG/ACT SUSP Place 1 spray into the nose 2 (two) times a day. 03/17/15  Yes [provider]  budesonide (PULMICORT) 0.5 MG/2ML nebulizer solution Place 10 mLs into both nostrils 2 (two) times a day. Add 10 ml (one dose) of medication to 240 ml of saline in sinus rinse bottle. Irrigate sinuses with 120 ml through each nostril twice daily 04/23/18  Yes [provider]  budesonide-formoterol (SYMBICORT) 160-4.5 MCG/ACT inhaler Inhale 2 puffs into the lungs daily. am   Yes [provider]  Cholecalciferol (VITAMIN D) 2000 UNITS tablet Take 2,000 Units by mouth daily. am   Yes [provider]  etanercept (ENBREL) 50 MG/ML injection Inject 50 mg into the skin once a week.   Yes [provider]   glipiZIDE (GLUCOTROL XL) 2.5 MG 24 hr tablet Take 2.5 mg by mouth daily with breakfast.   Yes [provider]  lisinopril (PRINIVIL,ZESTRIL) 40 MG tablet Take 40 mg by mouth daily.   Yes [provider]  loratadine (CLARITIN) 10 MG tablet Take 10 mg by mouth daily.   Yes [provider]  montelukast (SINGULAIR) 10 MG tablet Take 10 mg by mouth at bedtime.   Yes [provider]  oxybutynin (DITROPAN-XL) 5 MG 24 hr tablet Take 5 mg by mouth daily.   Yes [provider]  tamsulosin (FLOMAX) 0.4 MG CAPS capsule Take 0.4 mg by mouth daily after breakfast.   Yes [provider]  tiZANidine (ZANAFLEX) 4 MG capsule Take 4 mg by mouth daily. Pm   Yes [provider]  valsartan (DIOVAN) 320 MG tablet Take 320 mg by mouth daily. Am   Yes [provider]  acetaminophen (TYLENOL) 500 MG tablet Take 500 mg by mouth every 6 (six) hours.    [provider]  albuterol (PROVENTIL HFA;VENTOLIN HFA) 108 (90 BASE) MCG/ACT inhaler Inhale 2 puffs into the lungs as needed for wheezing or shortness of breath.  [provider]      VITAL SIGNS:  Blood pressure (!) 168/100, pulse 81, temperature 98.6 F (37 C), temperature source Oral, resp. rate (!) 24, height 6\' 2"  (1.88 m), weight 115.7 kg, SpO2 96 %.  PHYSICAL EXAMINATION:   Physical Exam  GENERAL:  73 y.o.-year-old patient lying in the bed with no acute distress.  EYES: Pupils equal, round, reactive to light and accommodation. No scleral icterus. Extraocular muscles intact.  HEENT: Head atraumatic, normocephalic. Oropharynx and nasopharynx clear.  NECK:  Supple, no jugular venous distention. No thyroid enlargement, no tenderness.  LUNGS: Normal breath sounds bilaterally, no wheezing, rales,rhonchi or crepitation. No use of accessory muscles of respiration.  CARDIOVASCULAR: S1, S2 normal. No murmurs, rubs, or gallops.  ABDOMEN: Soft, nontender, nondistended. Bowel sounds  present. No organomegaly or mass.  EXTREMITIES: No pedal edema, cyanosis, or clubbing. No rash or lesions. + pedal pulses MUSCULOSKELETAL: Normal bulk, and power was 5+ grip and elbow, knee, and ankle flexion and extension bilaterally.  NEUROLOGIC: Alert and oriented x 3. CN 2-12 intact. Sensation to light touch and cold stimuli intact bilaterally. Finger to nose nl. Babinski is downgoing. DTR's (biceps, patellar, and achilles) 2+ and symmetric throughout. Gait not tested due to safety concern. PSYCHIATRIC: The patient is alert and oriented x 3.  SKIN: No obvious rash, lesion, or ulcer.   DATA REVIEWED:  LABORATORY PANEL:   CBC Recent Labs  Lab 08/20/18 1500  WBC 8.7  HGB 16.7  HCT 51.7  PLT 161   ------------------------------------------------------------------------------------------------------------------  Chemistries  Recent Labs  Lab 08/20/18 1500  NA 142  K 4.1  CL 106  CO2 29  GLUCOSE 151*  BUN 18  CREATININE 1.20  CALCIUM 9.1  AST 56*  ALT 54*  ALKPHOS 156*  BILITOT 0.7   ------------------------------------------------------------------------------------------------------------------  Cardiac Enzymes No results for input(s): TROPONINI in the last 168 hours. ------------------------------------------------------------------------------------------------------------------  RADIOLOGY:  Mr Brain Wo Contrast  Result Date: 08/20/2018 CLINICAL DATA:  Altered mental status. Disorientation. Symptoms began 3 days ago. EXAM: MRI HEAD WITHOUT CONTRAST TECHNIQUE: Multiplanar, multiecho pulse sequences of the brain and surrounding structures were obtained without intravenous contrast. COMPARISON:  02/04/2016 FINDINGS: Brain: Diffusion imaging shows patchy areas of acute infarction in the right parietal lobe consistent with right MCA branch vessel territory infarction. Low level petechial blood products present in some of the areas of involvement. No frank hematoma. Mild  brain swelling but no mass effect or shift. Elsewhere, there are mild chronic small-vessel ischemic changes of the hemispheric white matter. No sign of mass lesion, hydrocephalus or extra-axial collection. Vascular: Major vessels at the base of the brain show flow. Skull and upper cervical spine: Negative Sinuses/Orbits: Mild mucosal thickening of the paranasal sinuses. Previous functional endoscopic sinus surgery. Orbits negative. Other: None IMPRESSION: Areas of acute infarction in the right parietal lobe consistent with right MCA branch vessel infarction. Mild swelling. Some petechial blood products, particularly along the posterior areas of involvement. No frank hematoma. No mass effect. Mild chronic small-vessel ischemic change elsewhere. Electronically Signed   By: Nelson Chimes M.D.   On: 08/20/2018 14:29   EKG:  EKG: normal EKG, normal sinus rhythm, unchanged from previous tracings. Vent. rate 74 BPM PR interval * ms QRS duration 127 ms QT/QTc 410/455 ms P-R-T axes 58 -15 57 IMPRESSION AND PLAN:   73 y.o. male  benign neoplasm of colon, type 2 diabetes mellitus, hyperlipidemia, ankylosing spondylitis, hypertension, asthma, and BPH presenting to the ED with complaints of memory loss, confusion  and disorientation since 08/17/2018.  1. Acute right parietal lobe stroke - causing memory loss, confusion and disorientation. Etiology likely small vessel disease - Patient was not on any antiplatelet or anticoagulation prior to this event. - Admit to medsurg unit with telemetry monitoring - HgbA1c, fasting lipid panel - PT consult, OT consult, Speech consult - Echocardiogram - Carotid dopplers - Prophylactic therapy-Antiplatelet med: Aspirin - dose 81 - NPO until RN stroke swallow screen - Telemetry monitoring - Frequent neuro checks - Neurology consult - message sent via [facility] to Dr. Creig Hines  2. HLD  + Goal LDL<100 - Atorvastatin 80mg  PO qhs  3. HTN  + Goal BP <160/90 will allow permissive  hypertension in the setting of stroke - Continue lisinopril, amlodipine  4. DM  - Check hgb A1c - Hold glipizide as patient is NPO - start SSI  5. DVT prophylaxis - Hold anticoagulation due to petechial hemorrhage on MRI   All the records are reviewed and case discussed with ED provider. Management plans discussed with the patient, family and they are in agreement.  CODE STATUS: FULL  TOTAL TIME TAKING CARE OF THIS PATIENT: 40 minutes.   on 08/20/2018 at 6:15 PM  Rufina Falco, DNP, FNP-BC [facility] Nurse Practitioner Between 7am to 6pm - Pager [phone]  After 6pm go to [email or web address] - password [email or web address] [facility]  Office  [phone]  CC: Primary care physician; Adin Hector, MD

## 2018-08-20 NOTE — ED Notes (Signed)
ED TO INPATIENT HANDOFF REPORT  ED Nurse Name and Phone #: Kasumi Ditullio [phone]  S Name/Age/Gender Jason Doyle 73 y.o. male Room/Bed: ED10A/ED10A  Code Status   Code Status: Full Code  Home/SNF/Other Home Patient oriented to: self, place, time and situation Is this baseline? Yes   Triage Complete: Triage complete  Chief Complaint sent by mri/possible stroke  Triage Note Pt c/o feeling like he was in a tunnel on Friday, disoriented, states he got a little better but it has not cleared up and today saw PCP who sent him to MRI and pt was brought straight from MRI to the ED due to positive MRI.   Allergies No Known Allergies  Level of Care/Admitting Diagnosis ED Disposition    ED Disposition Condition [facility] Area: [place] [[id number]]  Level of Care: Med-Surg [16]  Covid Evaluation: Confirmed COVID Negative  Diagnosis: Stroke (cerebrum) [facility]) [[id number]]  Admitting Physician: Eula Flax  Attending Physician: Rufina Falco ACHIENG [phone]  Estimated length of stay: past midnight tomorrow  Certification:: I certify this patient will need inpatient services for at least 2 midnights  PT Class (Do Not Modify): Inpatient [101]  PT Acc Code (Do Not Modify): Private [1]       B Medical/Surgery History Past Medical History:  Diagnosis Date  . Ankylosing spondylitis ([place])   . Arthritis    knee,hands,back and neck  . Asthma    LAST ATTACK 4-5 YRS AGO  . BPH (benign prostatic hyperplasia)   . Diabetes mellitus without complication ([place])    TYPE 2  . Gout   . H/O multiple allergies    SEASONAL,ENVIRONMENTAL  . H/O sinus surgery    x4  . Headache    sinus  . Hypertension   . Rotator cuff tear    left shoulder  . Sleep apnea    C-PAP   Past Surgical History:  Procedure Laterality Date  . BICEPT TENODESIS Left 07/23/2014   Procedure: BICEPS TENODESIS;  Surgeon: [name], MD;  Location: [place];   Service: Orthopedics;  Laterality: Left;  . COLONOSCOPY WITH PROPOFOL N/A 04/10/2017   Procedure: COLONOSCOPY WITH PROPOFOL;  Surgeon: [name], MD;  Location: [facility] ENDOSCOPY;  Service: Endoscopy;  Laterality: N/A;  . KNEE ARTHROSCOPY    . NASAL SINUS SURGERY     x 4  . SHOULDER ARTHROSCOPY WITH ROTATOR CUFF REPAIR AND SUBACROMIAL DECOMPRESSION Left 07/23/2014   Procedure: SHOULDER ARTHROSCOPY WITH ROTATOR CUFF REPAIR AND SUBACROMIAL DECOMPRESSION;  Surgeon: [name], MD;  Location: [place];  Service: Orthopedics;  Laterality: Left;  . UVULOPALATOPHARYNGOPLASTY       A IV Location/Drains/Wounds Patient Lines/Drains/Airways Status   Active Line/Drains/Airways    Name:   Placement date:   Placement time:   Site:   Days:   Peripheral IV 08/20/18 Left Forearm   08/20/18    1458    Forearm   less than 1   Incision (Closed) 07/23/14 Shoulder Left   07/23/14    1506     1489          Intake/Output Last 24 hours No intake or output data in the 24 hours ending 08/20/18 1714  Labs/Imaging Results for orders placed or performed during the hospital encounter of 08/20/18 (from the past 48 hour(s))  Protime-INR     Status: None   Collection Time: 08/20/18  3:00 PM  Result Value Ref Range   Prothrombin Time  13.7 11.4 - 15.2 seconds   INR 1.1 0.8 - 1.2    Comment: (NOTE) INR goal varies based on device and disease states. Performed at [facility], [address] [place], [place] [address]   APTT     Status: None   Collection Time: 08/20/18  3:00 PM  Result Value Ref Range   aPTT 29 24 - 36 seconds    Comment: Performed at [facility], [address] [place], [place] [address]  CBC     Status: None   Collection Time: 08/20/18  3:00 PM  Result Value Ref Range   WBC 8.7 4.0 - 10.5 K/uL   RBC 5.69 4.22 - 5.81 MIL/uL   Hemoglobin 16.7 13.0 - 17.0 g/dL   HCT 51.7 39.0 - 52.0 %   MCV 90.9 80.0 - 100.0 fL   MCH 29.3 26.0 - 34.0 pg   MCHC 32.3  30.0 - 36.0 g/dL   RDW 13.8 11.5 - 15.5 %   Platelets 161 150 - 400 K/uL   nRBC 0.0 0.0 - 0.2 %    Comment: Performed at [facility], [address] [place], [place] [address]  Differential     Status: Abnormal   Collection Time: 08/20/18  3:00 PM  Result Value Ref Range   Neutrophils Relative % 40 %   Neutro Abs 3.5 1.7 - 7.7 K/uL   Lymphocytes Relative 50 %   Lymphs Abs 4.3 (H) 0.7 - 4.0 K/uL   Monocytes Relative 9 %   Monocytes Absolute 0.8 0.1 - 1.0 K/uL   Eosinophils Relative 1 %   Eosinophils Absolute 0.1 0.0 - 0.5 K/uL   Basophils Relative 0 %   Basophils Absolute 0.0 0.0 - 0.1 K/uL   Immature Granulocytes 0 %   Abs Immature Granulocytes 0.02 0.00 - 0.07 K/uL    Comment: Performed at [facility], [address] [place], [place] [address]  Comprehensive metabolic panel     Status: Abnormal   Collection Time: 08/20/18  3:00 PM  Result Value Ref Range   Sodium 142 135 - 145 mmol/L   Potassium 4.1 3.5 - 5.1 mmol/L   Chloride 106 98 - 111 mmol/L   CO2 29 22 - 32 mmol/L   Glucose, Bld 151 (H) 70 - 99 mg/dL   BUN 18 8 - 23 mg/dL   Creatinine, Ser 1.20 0.61 - 1.24 mg/dL   Calcium 9.1 8.9 - 10.3 mg/dL   Total Protein 7.5 6.5 - 8.1 g/dL   Albumin 4.3 3.5 - 5.0 g/dL   AST 56 (H) 15 - 41 U/L   ALT 54 (H) 0 - 44 U/L   Alkaline Phosphatase 156 (H) 38 - 126 U/L   Total Bilirubin 0.7 0.3 - 1.2 mg/dL   GFR calc non Af Amer >60 >60 mL/min   GFR calc Af Amer >60 >60 mL/min   Anion gap 7 5 - 15    Comment: Performed at [facility], [address] [place], [place] [address]  Glucose, capillary     Status: Abnormal   Collection Time: 08/20/18  3:06 PM  Result Value Ref Range   Glucose-Capillary 161 (H) 70 - 99 mg/dL  SARS Coronavirus 2 (CEPHEID - Performed in [place] hospital lab), Hosp Order     Status: None   Collection Time: 08/20/18  3:24 PM   Specimen: Nasopharyngeal Swab  Result Value Ref Range   SARS Coronavirus 2 NEGATIVE NEGATIVE     Comment: (  NOTE) If result is NEGATIVE SARS-CoV-2 target nucleic acids are NOT DETECTED. The SARS-CoV-2 RNA is generally detectable in upper and lower  respiratory specimens during the acute phase of infection. The lowest  concentration of SARS-CoV-2 viral copies this assay can detect is 250  copies / mL. A negative result does not preclude SARS-CoV-2 infection  and should not be used as the sole basis for treatment or other  patient management decisions.  A negative result may occur with  improper specimen collection / handling, submission of specimen other  than nasopharyngeal swab, presence of viral mutation(s) within the  areas targeted by this assay, and inadequate number of viral copies  (<250 copies / mL). A negative result must be combined with clinical  observations, patient history, and epidemiological information. If result is POSITIVE SARS-CoV-2 target nucleic acids are DETECTED. The SARS-CoV-2 RNA is generally detectable in upper and lower  respiratory specimens dur ing the acute phase of infection.  Positive  results are indicative of active infection with SARS-CoV-2.  Clinical  correlation with patient history and other diagnostic information is  necessary to determine patient infection status.  Positive results do  not rule out bacterial infection or co-infection with other viruses. If result is PRESUMPTIVE POSTIVE SARS-CoV-2 nucleic acids MAY BE PRESENT.   A presumptive positive result was obtained on the submitted specimen  and confirmed on repeat testing.  While 2019 novel coronavirus  (SARS-CoV-2) nucleic acids may be present in the submitted sample  additional confirmatory testing may be necessary for epidemiological  and / or clinical management purposes  to differentiate between  SARS-CoV-2 and other Sarbecovirus currently known to infect humans.  If clinically indicated additional testing with an alternate test  methodology [phone]) is advised. The SARS-CoV-2  RNA is generally  detectable in upper and lower respiratory sp ecimens during the acute  phase of infection. The expected result is Negative. Fact Sheet for Patients:  StrictlyIdeas.no Fact Sheet for Healthcare Providers: BankingDealers.co.za This test is not yet approved or cleared by the [place] FDA and has been authorized for detection and/or diagnosis of SARS-CoV-2 by FDA under an Emergency Use Authorization (EUA).  This EUA will remain in effect (meaning this test can be used) for the duration of the COVID-19 declaration under Section 564(b)(1) of the Act, 21 U.S.C. section 360bbb-3(b)(1), unless the authorization is terminated or revoked sooner. Performed at [facility], [address] [place], [place] [address]    Mr Brain YK Contrast  Result Date: 08/20/2018 CLINICAL DATA:  Altered mental status. Disorientation. Symptoms began 3 days ago. EXAM: MRI HEAD WITHOUT CONTRAST TECHNIQUE: Multiplanar, multiecho pulse sequences of the brain and surrounding structures were obtained without intravenous contrast. COMPARISON:  02/04/2016 FINDINGS: Brain: Diffusion imaging shows patchy areas of acute infarction in the right parietal lobe consistent with right MCA branch vessel territory infarction. Low level petechial blood products present in some of the areas of involvement. No frank hematoma. Mild brain swelling but no mass effect or shift. Elsewhere, there are mild chronic small-vessel ischemic changes of the hemispheric white matter. No sign of mass lesion, hydrocephalus or extra-axial collection. Vascular: Major vessels at the base of the brain show flow. Skull and upper cervical spine: Negative Sinuses/Orbits: Mild mucosal thickening of the paranasal sinuses. Previous functional endoscopic sinus surgery. Orbits negative. Other: None IMPRESSION: Areas of acute infarction in the right parietal lobe consistent with right MCA branch  vessel infarction. Mild swelling. Some petechial blood products, particularly along the posterior areas of  involvement. No frank hematoma. No mass effect. Mild chronic small-vessel ischemic change elsewhere. Electronically Signed   By: Nelson Chimes M.D.   On: 08/20/2018 14:29    Pending Labs Unresulted Labs (From admission, onward)    Start     Ordered   08/21/18 0500  Hemoglobin A1c  Tomorrow morning,   STAT     08/20/18 1703   08/21/18 0500  Lipid panel  Tomorrow morning,   STAT    Comments: Fasting    08/20/18 1703          Vitals/Pain Today's Vitals   08/20/18 1445 08/20/18 1446 08/20/18 1530  BP: (!) 178/109  (!) 168/100  Pulse: 86  81  Resp: 18  (!) 24  Temp: 98.6 F (37 C)    TempSrc: Oral    SpO2: 97%  96%  Weight:  115.7 kg   Height:  6\' 2"  (1.88 m)   PainSc:  0-No pain     Isolation Precautions No active isolations  Medications Medications  allopurinol (ZYLOPRIM) tablet 100 mg (has no administration in time range)  etanercept (ENBREL) 50 MG/ML injection 50 mg (has no administration in time range)  amLODipine (NORVASC) tablet 5 mg (has no administration in time range)  atorvastatin (LIPITOR) tablet 80 mg (has no administration in time range)  lisinopril (ZESTRIL) tablet 40 mg (has no administration in time range)  tamsulosin (FLOMAX) capsule 0.4 mg (has no administration in time range)  tiZANidine (ZANAFLEX) capsule 4 mg (has no administration in time range)  cholecalciferol (VITAMIN D3) tablet 2,000 Units (has no administration in time range)  albuterol (PROVENTIL) (2.5 MG/3ML) 0.083% nebulizer solution 2.5 mg (has no administration in time range)  mometasone-formoterol (DULERA) 200-5 MCG/ACT inhaler 2 puff (has no administration in time range)  loratadine (CLARITIN) tablet 10 mg (has no administration in time range)  montelukast (SINGULAIR) tablet 10 mg (has no administration in time range)   stroke: mapping our early stages of recovery book (has no  administration in time range)  0.9 %  sodium chloride infusion (has no administration in time range)  acetaminophen (TYLENOL) tablet 650 mg (has no administration in time range)    Or  acetaminophen (TYLENOL) solution 650 mg (has no administration in time range)    Or  acetaminophen (TYLENOL) suppository 650 mg (has no administration in time range)  senna-docusate (Senokot-S) tablet 1 tablet (has no administration in time range)  sodium chloride flush (NS) 0.9 % injection 3 mL (3 mLs Intravenous Given by Other 08/20/18 1458)    Mobility walks Low fall risk   Focused Assessments Neuro Assessment Handoff:  Swallow screen pass? Yes    NIH Stroke Scale ( + Modified Stroke Scale Criteria)  Interval: Initial Level of Consciousness (1a.)   : Alert, keenly responsive LOC Questions (1b. )   +: Answers both questions correctly LOC Commands (1c. )   + : Performs both tasks correctly Best Gaze (2. )  +: Normal Visual (3. )  +: No visual loss Facial Palsy (4. )    : Normal symmetrical movements Motor Arm, Left (5a. )   +: No drift Motor Arm, Right (5b. )   +: No drift Motor Leg, Left (6a. )   +: No drift Motor Leg, Right (6b. )   +: No drift Limb Ataxia (7. ): Absent Sensory (8. )   +: Normal, no sensory loss Best Language (9. )   +: No aphasia Dysarthria (10. ): Normal Extinction/Inattention (11.)   +: No  Abnormality Modified SS Total  +: 0 Complete NIHSS TOTAL: 0     Neuro Assessment:   Neuro Checks:   Initial (08/20/18 1509)  Last Documented NIHSS Modified Score: 0 (08/20/18 1509) Has TPA been given? No If patient is a Neuro Trauma and patient is going to OR before floor call report to [place] nurse: [phone] or [phone]     R Recommendations: See Admitting Provider Note  Report given to:   Additional Notes:

## 2018-08-21 ENCOUNTER — Inpatient Hospital Stay: Payer: Medicare Other

## 2018-08-21 ENCOUNTER — Inpatient Hospital Stay
Admit: 2018-08-21 | Discharge: 2018-08-21 | Disposition: A | Discharge status: Home / Self Care | Payer: Medicare Other | Attending: Nurse Practitioner | Admitting: Nurse Practitioner

## 2018-08-21 LAB — HEMOGLOBIN A1C
Hgb A1c MFr Bld: 7.3 % — ABNORMAL HIGH (ref 4.8–5.6)
Mean Plasma Glucose: 162.81 mg/dL

## 2018-08-21 LAB — LIPID PANEL
Cholesterol: 115 mg/dL (ref 0–200)
HDL: 40 mg/dL — ABNORMAL LOW (ref >40)
LDL Cholesterol: 65 mg/dL (ref 0–99)
Total CHOL/HDL Ratio: 2.9 RATIO
Triglycerides: 49 mg/dL (ref <150)
VLDL: 10 mg/dL (ref 0–40)

## 2018-08-21 LAB — ECHOCARDIOGRAM COMPLETE
Height: 74 in
Weight: 4080 oz

## 2018-08-21 MED ORDER — ASPIRIN 81 MG PO CHEW
81.0000 mg | CHEWABLE_TABLET | Freq: Every day | ORAL | Status: DC
  Filled 2018-08-21: qty 1

## 2018-08-21 MED ORDER — ASPIRIN 81 MG PO CHEW: 81.0000 mg | CHEWABLE_TABLET | Freq: Every day | ORAL | 0 refills | Status: AC

## 2018-08-21 NOTE — Discharge Summary (Signed)
[place] [facility] [place] NAME: Jason Doyle    MR#:  [id number]  DATE OF BIRTH:  16-Nov-1945  DATE OF ADMISSION:  08/20/2018 ADMITTING PHYSICIAN: Lang Snow, NP  DATE OF DISCHARGE: 08/21/2018   PRIMARY CARE PHYSICIAN: Adin Hector, MD    ADMISSION DIAGNOSIS:  Stroke (cerebrum) [facility]) [I63.9] Cerebrovascular accident (CVA), unspecified mechanism ([place]) [I63.9]  DISCHARGE DIAGNOSIS:  Active Problems:   Stroke (cerebrum) ([place])   SECONDARY DIAGNOSIS:   Past Medical History:  Diagnosis Date  . Ankylosing spondylitis ([place])   . Arthritis    knee,hands,back and neck  . Asthma    LAST ATTACK 4-5 YRS AGO  . BPH (benign prostatic hyperplasia)   . Diabetes mellitus without complication ([place])    TYPE 2  . Gout   . H/O multiple allergies    SEASONAL,ENVIRONMENTAL  . H/O sinus surgery    x4  . Headache    sinus  . Hypertension   . Rotator cuff tear    left shoulder  . Sleep apnea    C-PAP    HOSPITAL COURSE:   73 year old male with history of OSA on CPAP and diabetes who presented to the emergency room due to confusion.  1.  Acute right parietal stroke (MRI confirmed): Patient underwent complete stroke work-up including echocardiogram carotid Doppler.  He was evaluated by neurology along with physical therapy and Occupational Therapy.  His confusion has improved. He shows no signs of focal deficits. Recommendation are to continue statin and start aspirin in 10 days. He needs better blood pressure control as well. He will have outpatient follow-up with neurology in 4 weeks.   2.  Hypertension: He will continue Norvasc, lisinopril and Diovan 3.  Diabetes: Continue ADA diet with glipizide  4.  BPH: Continue Flomax    DISCHARGE CONDITIONS AND DIET:   Stable Diabetic diet     CONSULTS OBTAINED:  Treatment Team:  Neville Route, MD  DRUG ALLERGIES:  No Known Allergies  DISCHARGE MEDICATIONS:   Allergies as  of 08/21/2018   No Known Allergies     Medication List    TAKE these medications   acetaminophen 500 MG tablet Commonly known as: TYLENOL Take 500 mg by mouth every 6 (six) hours.   albuterol 108 (90 Base) MCG/ACT inhaler Commonly known as: VENTOLIN HFA Inhale 2 puffs into the lungs as needed for wheezing or shortness of breath.   allopurinol 100 MG tablet Commonly known as: ZYLOPRIM Take 100 mg by mouth daily. am   amLODipine 5 MG tablet Commonly known as: NORVASC Take 5 mg by mouth daily. am   aspirin 81 MG chewable tablet Chew 1 tablet (81 mg total) by mouth daily. Start on 08/31/2018 no earlier then that please thank you Start taking on: August 31, 2018   atorvastatin 80 MG tablet Commonly known as: LIPITOR Take 80 mg by mouth daily.   Azelastine-Fluticasone 137-50 MCG/ACT Susp Place 1 spray into the nose 2 (two) times a day.   budesonide 0.5 MG/2ML nebulizer solution Commonly known as: PULMICORT Place 10 mLs into both nostrils 2 (two) times a day. Add 10 ml (one dose) of medication to 240 ml of saline in sinus rinse bottle. Irrigate sinuses with 120 ml through each nostril twice daily   budesonide-formoterol 160-4.5 MCG/ACT inhaler Commonly known as: SYMBICORT Inhale 2 puffs into the lungs daily. am   etanercept 50 MG/ML injection Commonly known as: ENBREL Inject 50 mg into the skin once  a week.   glipiZIDE 2.5 MG 24 hr tablet Commonly known as: GLUCOTROL XL Take 2.5 mg by mouth daily with breakfast.   lisinopril 40 MG tablet Commonly known as: ZESTRIL Take 40 mg by mouth daily.   loratadine 10 MG tablet Commonly known as: CLARITIN Take 10 mg by mouth daily.   montelukast 10 MG tablet Commonly known as: SINGULAIR Take 10 mg by mouth at bedtime.   oxybutynin 5 MG 24 hr tablet Commonly known as: DITROPAN-XL Take 5 mg by mouth daily.   tamsulosin 0.4 MG Caps capsule Commonly known as: FLOMAX Take 0.4 mg by mouth daily after breakfast.   tiZANidine 4  MG capsule Commonly known as: ZANAFLEX Take 4 mg by mouth daily. Pm   valsartan 320 MG tablet Commonly known as: DIOVAN Take 320 mg by mouth daily. Am   Vitamin D 50 MCG (2000 UT) tablet Take 2,000 Units by mouth daily. am         Today   CHIEF COMPLAINT:  Doing better this am   VITAL SIGNS:  Blood pressure 131/71, pulse 63, temperature 98.5 F (36.9 C), temperature source Oral, resp. rate 17, height 6\' 2"  (1.88 m), weight 115.7 kg, SpO2 96 %.   REVIEW OF SYSTEMS:  Review of Systems  Constitutional: Negative.  Negative for chills, fever and malaise/fatigue.  HENT: Negative.  Negative for ear discharge, ear pain, hearing loss, nosebleeds and sore throat.   Eyes: Negative.  Negative for blurred vision and pain.  Respiratory: Negative.  Negative for cough, hemoptysis, shortness of breath and wheezing.   Cardiovascular: Negative.  Negative for chest pain, palpitations and leg swelling.  Gastrointestinal: Negative.  Negative for abdominal pain, blood in stool, diarrhea, nausea and vomiting.  Genitourinary: Negative.  Negative for dysuria.  Musculoskeletal: Negative.  Negative for back pain.  Skin: Negative.   Neurological: Positive for weakness. Negative for dizziness, tremors, speech change, focal weakness, seizures and headaches.  Endo/Heme/Allergies: Negative.  Does not bruise/bleed easily.  Psychiatric/Behavioral: Negative.  Negative for depression, hallucinations and suicidal ideas.     PHYSICAL EXAMINATION:  GENERAL:  73 y.o.-year-old patient lying in the bed with no acute distress.  NECK:  Supple, no jugular venous distention. No thyroid enlargement, no tenderness.  LUNGS: Normal breath sounds bilaterally, no wheezing, rales,rhonchi  No use of accessory muscles of respiration.  CARDIOVASCULAR: S1, S2 normal. No murmurs, rubs, or gallops.  ABDOMEN: Soft, non-tender, non-distended. Bowel sounds present. No organomegaly or mass.  EXTREMITIES: No pedal edema,  cyanosis, or clubbing.  PSYCHIATRIC: The patient is alert and oriented x 3.  SKIN: No obvious rash, lesion, or ulcer.   DATA REVIEW:   CBC Recent Labs  Lab 08/20/18 1500  WBC 8.7  HGB 16.7  HCT 51.7  PLT 161    Chemistries  Recent Labs  Lab 08/20/18 1500  NA 142  K 4.1  CL 106  CO2 29  GLUCOSE 151*  BUN 18  CREATININE 1.20  CALCIUM 9.1  AST 56*  ALT 54*  ALKPHOS 156*  BILITOT 0.7    Cardiac Enzymes No results for input(s): TROPONINI in the last 168 hours.  Microbiology Results  @MICRORSLT48 @  RADIOLOGY:  Mr Brain Wo Contrast  Result Date: 08/20/2018 CLINICAL DATA:  Altered mental status. Disorientation. Symptoms began 3 days ago. EXAM: MRI HEAD WITHOUT CONTRAST TECHNIQUE: Multiplanar, multiecho pulse sequences of the brain and surrounding structures were obtained without intravenous contrast. COMPARISON:  02/04/2016 FINDINGS: Brain: Diffusion imaging shows patchy areas of acute infarction in  the right parietal lobe consistent with right MCA branch vessel territory infarction. Low level petechial blood products present in some of the areas of involvement. No frank hematoma. Mild brain swelling but no mass effect or shift. Elsewhere, there are mild chronic small-vessel ischemic changes of the hemispheric white matter. No sign of mass lesion, hydrocephalus or extra-axial collection. Vascular: Major vessels at the base of the brain show flow. Skull and upper cervical spine: Negative Sinuses/Orbits: Mild mucosal thickening of the paranasal sinuses. Previous functional endoscopic sinus surgery. Orbits negative. Other: None IMPRESSION: Areas of acute infarction in the right parietal lobe consistent with right MCA branch vessel infarction. Mild swelling. Some petechial blood products, particularly along the posterior areas of involvement. No frank hematoma. No mass effect. Mild chronic small-vessel ischemic change elsewhere. Electronically Signed   By: Nelson Chimes M.D.   On:  08/20/2018 14:29      Allergies as of 08/21/2018   No Known Allergies     Medication List    TAKE these medications   acetaminophen 500 MG tablet Commonly known as: TYLENOL Take 500 mg by mouth every 6 (six) hours.   albuterol 108 (90 Base) MCG/ACT inhaler Commonly known as: VENTOLIN HFA Inhale 2 puffs into the lungs as needed for wheezing or shortness of breath.   allopurinol 100 MG tablet Commonly known as: ZYLOPRIM Take 100 mg by mouth daily. am   amLODipine 5 MG tablet Commonly known as: NORVASC Take 5 mg by mouth daily. am   aspirin 81 MG chewable tablet Chew 1 tablet (81 mg total) by mouth daily. Start on 08/31/2018 no earlier then that please thank you Start taking on: August 31, 2018   atorvastatin 80 MG tablet Commonly known as: LIPITOR Take 80 mg by mouth daily.   Azelastine-Fluticasone 137-50 MCG/ACT Susp Place 1 spray into the nose 2 (two) times a day.   budesonide 0.5 MG/2ML nebulizer solution Commonly known as: PULMICORT Place 10 mLs into both nostrils 2 (two) times a day. Add 10 ml (one dose) of medication to 240 ml of saline in sinus rinse bottle. Irrigate sinuses with 120 ml through each nostril twice daily   budesonide-formoterol 160-4.5 MCG/ACT inhaler Commonly known as: SYMBICORT Inhale 2 puffs into the lungs daily. am   etanercept 50 MG/ML injection Commonly known as: ENBREL Inject 50 mg into the skin once a week.   glipiZIDE 2.5 MG 24 hr tablet Commonly known as: GLUCOTROL XL Take 2.5 mg by mouth daily with breakfast.   lisinopril 40 MG tablet Commonly known as: ZESTRIL Take 40 mg by mouth daily.   loratadine 10 MG tablet Commonly known as: CLARITIN Take 10 mg by mouth daily.   montelukast 10 MG tablet Commonly known as: SINGULAIR Take 10 mg by mouth at bedtime.   oxybutynin 5 MG 24 hr tablet Commonly known as: DITROPAN-XL Take 5 mg by mouth daily.   tamsulosin 0.4 MG Caps capsule Commonly known as: FLOMAX Take 0.4 mg by mouth  daily after breakfast.   tiZANidine 4 MG capsule Commonly known as: ZANAFLEX Take 4 mg by mouth daily. Pm   valsartan 320 MG tablet Commonly known as: DIOVAN Take 320 mg by mouth daily. Am   Vitamin D 50 MCG (2000 UT) tablet Take 2,000 Units by mouth daily. am         Management plans discussed with the patient and he is in agreement. Stable for discharge   Patient should follow up with neurology and PCP  CODE STATUS:  Code Status Orders  (From admission, onward)         Start     Ordered   08/20/18 1659  Full code  Continuous     08/20/18 1703        Code Status History    Date Active Date Inactive Code Status Order ID Comments User Context   07/23/2014 1552 07/23/2014 1950 Full Code [id number]  Poggi, Marshall Cork, MD Inpatient   Advance Care Planning Activity    Advance Directive Documentation     Most Recent Value  Type of Advance Directive  Healthcare Power of [place], Living will  Pre-existing out of facility DNR order (yellow form or pink MOST form)  -  "MOST" Form in Place?  -      TOTAL TIME TAKING CARE OF THIS PATIENT: 38 minutes.    Note: This dictation was prepared with Dragon dictation along with smaller phrase technology. Any transcriptional errors that result from this process are unintentional.  Bettey Costa M.D on 08/21/2018 at 10:36 AM  Between 7am to 6pm - Pager - [phone] After 6pm go to [email or web address] - password [email or web address] [facility]  Office  [phone]  CC: Primary care physician; Adin Hector, MD

## 2018-08-21 NOTE — TOC Transition Note (Signed)
Transition of Care (TOC) - CM/SW Discharge Note   Patient Details  Name: Jason Doyle MRN: [id number] Date of Birth: 04/12/1945  Transition of Care (TOC) CM/SW Contact:  Sample, Bailey M, LCSW Phone Number: [phone]  08/21/2018, 12:12 PM   Clinical Narrative: PT and OT recommended no follow up. MD ordered home health RN and discharged patient today. Clinical Social Worker (CSW) met with patient to arrange home health services. Patient was alert and oriented X4 and was laying in the bed. CSW introduced self and explained role of CSW department. Per patient he lives in [place] with his wife [name] and is very active. Patient reported that he walks often and tries to stay in good shape. Patient reported that he walked without a walker with PT and does not need one. Patient is agreeable to home health RN. CSW provided patient with home health list from medicare.gov. Patient is agreeable to [facility] home health. Brittany [facility] home health representative is aware of above. RN aware of above. Please reconsult if future social work needs arise. CSW signing off.      Final next level of care: Home w Home Health Services     Patient Goals and CMS Choice Patient states their goals for this hospitalization and ongoing recovery are:: Improve health CMS Medicare.gov Compare Post Acute Care list provided to:: Patient Choice offered to / list presented to : Patient  Discharge Placement                       Discharge Plan and Services                          HH Arranged: RN HH Agency: [facility] Date HH Agency Contacted: 08/21/18   Representative spoke with at HH Agency: Brittany  Social Determinants of Health (SDOH) Interventions     Readmission Risk Interventions No flowsheet data found.     

## 2018-08-21 NOTE — Progress Notes (Signed)
SLP Cancellation Note  Patient Details Name: Jason Doyle MRN: [id number] DOB: 02-Aug-1945   Cancelled treatment:       Reason Eval/Treat Not Completed: SLP screened, no needs identified, will sign off(chart reviewed; consulted NSG then met w/ pt). Pt denied any difficulty swallowing and is currently on a regular diet; tolerates swallowing pills w/ water per NSG. Pt conversed at conversational level w/out deficits noted; pt and NSG denied any speech-language deficits at this time. Pt ordered meal via phone calling the kitchen and placing his order.  No further skilled ST services indicated as pt appears at his baseline. Pt agreed. NSG to reconsult if any change in status.      Orinda Kenner, MS, CCC-SLP Ambika Zettlemoyer 08/21/2018, 12:17 PM

## 2018-08-21 NOTE — Progress Notes (Signed)
*  PRELIMINARY RESULTS* Echocardiogram 2D Echocardiogram has been performed.  Jason Doyle 08/21/2018, 12:08 PM

## 2018-08-21 NOTE — Evaluation (Signed)
Physical Therapy Evaluation Patient Details Name: Jason Doyle MRN: [id number] DOB: 1945-08-02 Today's Date: 08/21/2018   History of Present Illness  Pt is a 73 y.o. male presenting to hospital 08/20/18 with memory disturbance and forgetfullness x3 days; OP MRI (+) CVA.  Imaging showing R parietal lobe acute infarct (some petechial blood products, particularly along posterior areas of involvement; no mass effect).  PMH includes ankylosing spondylitis, DM, htn, asthma, L RCR.  Clinical Impression  Prior to hospital admission, pt was independent and active.  Pt lives with his wife in multi-level home.  Currently pt is independent with bed mobility, transfers, ambulation, and modified independent with stairs navigation.  No loss of balance noted during functional mobility and dynamic balance activities.  B LE light touch, sensation, heel to shin coordination, proprioception, strength, and tone intact.  No cognitive impairments noted.   No acute skilled PT needs identified; will sign off and complete current PT order.    Follow Up Recommendations No PT follow up    Equipment Recommendations  None recommended by PT    Recommendations for Other Services OT consult     Precautions / Restrictions Precautions Precautions: Fall Restrictions Weight Bearing Restrictions: No      Mobility  Bed Mobility Overal bed mobility: Independent             General bed mobility comments: No difficulties noted.  Transfers Overall transfer level: Independent Equipment used: None             General transfer comment: no difficulties noted  Ambulation/Gait Ambulation/Gait assistance: Independent Gait Distance (Feet): 200 Feet Assistive device: None Gait Pattern/deviations: WFL(Within Functional Limits)     General Gait Details: steady safe ambulation  Stairs Stairs: Yes Stairs assistance: Modified independent (Device/Increase time) Stair Management: Alternating pattern;One rail  Left;Forwards Number of Stairs: 4 General stair comments: no difficulties noted  Wheelchair Mobility    Modified Rankin (Stroke Patients Only)       Balance Overall balance assessment: Independent(No loss of balance with ambulation and head turns R/L/up/down, increasing/decreasing speed, and turning and stopping)                                           Pertinent Vitals/Pain Pain Assessment: No/denies pain  Vitals (HR and O2 on room air) stable and WFL throughout treatment session.    Home Living Family/patient expects to be discharged to:: Private residence Living Arrangements: Spouse/significant other Available Help at Discharge: Family Type of Home: House Home Access: Stairs to enter Entrance Stairs-Rails: None Entrance Stairs-Number of Steps: 1 Home Layout: Two level;Bed/bath upstairs Home Equipment: None      Prior Function Level of Independence: Independent         Comments: Pt reports no falls in past 6 months.     Hand Dominance        Extremity/Trunk Assessment   Upper Extremity Assessment Upper Extremity Assessment: Defer to OT evaluation;Overall WFL for tasks assessed    Lower Extremity Assessment Lower Extremity Assessment: RLE deficits/detail;LLE deficits/detail RLE Deficits / Details: hip flexion, knee flexion/extension, and DF 5/5; able to perform SLR independently(intact B LE sensation, tone, proprioception, and heel to shin coordination) LLE Deficits / Details: hip flexion, knee flexion/extension, and DF 5/5; able to perform SLR independently    Cervical / Trunk Assessment Cervical / Trunk Assessment: Normal  Communication   Communication: No difficulties  Cognition  Arousal/Alertness: Awake/alert Behavior During Therapy: WFL for tasks assessed/performed Overall Cognitive Status: Within Functional Limits for tasks assessed                                        General Comments   Nursing cleared pt  for participation in physical therapy.  Pt agreeable to PT session.  Neurologist MD present during session and cleared pt for activity.     Exercises     Assessment/Plan    PT Assessment Patent does not need any further PT services  PT Problem List         PT Treatment Interventions      PT Goals (Current goals can be found in the Care Plan section)  Acute Rehab PT Goals Patient Stated Goal: to go home PT Goal Formulation: With patient Time For Goal Achievement: 09/04/18 Potential to Achieve Goals: Good    Frequency     Barriers to discharge        Co-evaluation               AM-PAC PT "6 Clicks" Mobility  Outcome Measure Help needed turning from your back to your side while in a flat bed without using bedrails?: None Help needed moving from lying on your back to sitting on the side of a flat bed without using bedrails?: None Help needed moving to and from a bed to a chair (including a wheelchair)?: None Help needed standing up from a chair using your arms (e.g., wheelchair or bedside chair)?: None Help needed to walk in hospital room?: None Help needed climbing 3-5 steps with a railing? : None 6 Click Score: 24    End of Session Equipment Utilized During Treatment: Gait belt Activity Tolerance: Patient tolerated treatment well Patient left: in bed;with call bell/phone within reach Nurse Communication: Mobility status;Precautions PT Visit Diagnosis: Other symptoms and signs involving the nervous system (R29.898)    Time: 9470-9628 PT Time Calculation (min) (ACUTE ONLY): 36 min   Charges:   PT Evaluation $PT Eval Low Complexity: 1 Low         [place], PT 08/21/18, 11:24 AM [phone]

## 2018-08-21 NOTE — Consult Note (Signed)
Reason for Consult:ER Referring Physician: right parietal stroke  CC: Right parietal stroke  HPI: Jason Doyle is an 73 y.o. male presents to ER with MRI finding of right parietal stroke. 73 y/o with asthma, HTN, diabetes reffered by his PCP after MRI showing a right parietal stroke.  Onset of symptoms when he woke up Friday morning felt like he was "having out of body experience".  Patient states he was able to do his daily chores throughout the day however he seemed confused. He did not seek medcial attention at that time. Next day, he states he was having trouble concentrating and do his  usual task.  he thought he grilled the chicken but they were still in the cooler. Denies associated speech abnormality, cranial nerve deficit, seizures, focal motor or sensory deficits, diplopia, nausea or vomiting, headache, dizziness,ipsilateral or contralateral paralysis/weakness, numbness or tingling, involuntary movements, tremor. He denies history of head injury or trauma.  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 patient was sent to the ED for further evaluation.  On arrival to the ED, he was afebrile with blood pressure 178/109 mm Hg and pulse rate 109 beats/min. There were no focal neurological deficits; he was alert and oriented x4, and he did not demonstrate any memory deficits. Complete blood count (CBC) unremarkable, comprehensive metabolic panel (CMP)  showed elevated AST 56 and ALT 54, alkphos 156. He has remote hx of fatty liver disease. ECG showed sinus rhythm of 51 beats per minute.  The patient has remained free from recurrent events and maintains a secondary prevention medication regimen including 81mg  of aspirin and 80mg  of atorvastatin.  He will be admitted under hospitalist service for stroke work-up and management. Past Medical History:  Diagnosis Date  . Ankylosing spondylitis  ([place])   . Arthritis    knee,hands,back and neck  . Asthma    LAST ATTACK 4-5 YRS AGO  . BPH (benign prostatic hyperplasia)   . Diabetes mellitus without complication ([place])    TYPE 2  . Gout   . H/O multiple allergies    SEASONAL,ENVIRONMENTAL  . H/O sinus surgery    x4  . Headache    sinus  . Hypertension   . Rotator cuff tear    left shoulder  . Sleep apnea    C-PAP    Past Surgical History:  Procedure Laterality Date  . BICEPT TENODESIS Left 07/23/2014   Procedure: BICEPS TENODESIS;  Surgeon: [name], MD;  Location: [place];  Service: Orthopedics;  Laterality: Left;  . COLONOSCOPY WITH PROPOFOL N/A 04/10/2017   Procedure: COLONOSCOPY WITH PROPOFOL;  Surgeon: [name], MD;  Location: [facility] ENDOSCOPY;  Service: Endoscopy;  Laterality: N/A;  . KNEE ARTHROSCOPY    . NASAL SINUS SURGERY     x 4  . SHOULDER ARTHROSCOPY WITH ROTATOR CUFF REPAIR AND SUBACROMIAL DECOMPRESSION Left 07/23/2014   Procedure: SHOULDER ARTHROSCOPY WITH ROTATOR CUFF REPAIR AND SUBACROMIAL DECOMPRESSION;  Surgeon: [name], MD;  Location: [place] [facility];  Service: Orthopedics;  Laterality: Left;  . UVULOPALATOPHARYNGOPLASTY      No family history on file.  Social History:  reports that he has never smoked. He has never used smokeless tobacco. He reports current alcohol use. No history on file for drug.  No Known Allergies  Medications: I have reviewed the patient's current medications.  ROS: Per HPI Physical  Examination: Blood pressure 131/71, pulse 63, temperature 98.5 F (36.9 C), temperature source Oral, resp. rate 17, height 6\' 2"  (1.88 m), weight 115.7 kg, SpO2 96 %.  Neurologic Examination Alert, awake, speech n;le, following commands, oriented x4 CN: PERLA, EOMI with subtle en gaze nystagmus, VFF,  Face symmetrical, face sensation is nle, palate and tongue midline. No motor deficit appreciated No sensory deficit appreciated No coordination deficit  appreciated DTR /gait not checked at time of exam  Results for orders placed or performed during the hospital encounter of 08/20/18 (from the past 48 hour(s))  Protime-INR     Status: None   Collection Time: 08/20/18  3:00 PM  Result Value Ref Range   Prothrombin Time 13.7 11.4 - 15.2 seconds   INR 1.1 0.8 - 1.2    Comment: (NOTE) INR goal varies based on device and disease states. Performed at [facility], [address] [name], [place] [address]   APTT     Status: None   Collection Time: 08/20/18  3:00 PM  Result Value Ref Range   aPTT 29 24 - 36 seconds    Comment: Performed at [facility], [address] [place], [place] [address]  CBC     Status: None   Collection Time: 08/20/18  3:00 PM  Result Value Ref Range   WBC 8.7 4.0 - 10.5 K/uL   RBC 5.69 4.22 - 5.81 MIL/uL   Hemoglobin 16.7 13.0 - 17.0 g/dL   HCT 51.7 39.0 - 52.0 %   MCV 90.9 80.0 - 100.0 fL   MCH 29.3 26.0 - 34.0 pg   MCHC 32.3 30.0 - 36.0 g/dL   RDW 13.8 11.5 - 15.5 %   Platelets 161 150 - 400 K/uL   nRBC 0.0 0.0 - 0.2 %    Comment: Performed at [facility], [address] [place], [place] [address]  Differential     Status: Abnormal   Collection Time: 08/20/18  3:00 PM  Result Value Ref Range   Neutrophils Relative % 40 %   Neutro Abs 3.5 1.7 - 7.7 K/uL   Lymphocytes Relative 50 %   Lymphs Abs 4.3 (H) 0.7 - 4.0 K/uL   Monocytes Relative 9 %   Monocytes Absolute 0.8 0.1 - 1.0 K/uL   Eosinophils Relative 1 %   Eosinophils Absolute 0.1 0.0 - 0.5 K/uL   Basophils Relative 0 %   Basophils Absolute 0.0 0.0 - 0.1 K/uL   Immature Granulocytes 0 %   Abs Immature Granulocytes 0.02 0.00 - 0.07 K/uL    Comment: Performed at [facility], [address] [place], [place] [address]  Comprehensive metabolic panel     Status: Abnormal   Collection Time: 08/20/18  3:00 PM  Result Value Ref Range   Sodium 142 135 - 145 mmol/L   Potassium 4.1 3.5 - 5.1 mmol/L    Chloride 106 98 - 111 mmol/L   CO2 29 22 - 32 mmol/L   Glucose, Bld 151 (H) 70 - 99 mg/dL   BUN 18 8 - 23 mg/dL   Creatinine, Ser 1.20 0.61 - 1.24 mg/dL   Calcium 9.1 8.9 - 10.3 mg/dL   Total Protein 7.5 6.5 - 8.1 g/dL   Albumin 4.3 3.5 - 5.0 g/dL   AST 56 (H) 15 - 41 U/L   ALT 54 (H) 0 - 44 U/L   Alkaline Phosphatase 156 (H) 38 - 126 U/L   Total Bilirubin 0.7 0.3 - 1.2 mg/dL  GFR calc non Af Amer >60 >60 mL/min   GFR calc Af Amer >60 >60 mL/min   Anion gap 7 5 - 15    Comment: Performed at [facility], [address] [place], [place] [address]  Glucose, capillary     Status: Abnormal   Collection Time: 08/20/18  3:06 PM  Result Value Ref Range   Glucose-Capillary 161 (H) 70 - 99 mg/dL  SARS Coronavirus 2 (CEPHEID - Performed in [place] hospital lab), Hosp Order     Status: None   Collection Time: 08/20/18  3:24 PM   Specimen: Nasopharyngeal Swab  Result Value Ref Range   SARS Coronavirus 2 NEGATIVE NEGATIVE    Comment: (NOTE) If result is NEGATIVE SARS-CoV-2 target nucleic acids are NOT DETECTED. The SARS-CoV-2 RNA is generally detectable in upper and lower  respiratory specimens during the acute phase of infection. The lowest  concentration of SARS-CoV-2 viral copies this assay can detect is 250  copies / mL. A negative result does not preclude SARS-CoV-2 infection  and should not be used as the sole basis for treatment or other  patient management decisions.  A negative result may occur with  improper specimen collection / handling, submission of specimen other  than nasopharyngeal swab, presence of viral mutation(s) within the  areas targeted by this assay, and inadequate number of viral copies  (<250 copies / mL). A negative result must be combined with clinical  observations, patient history, and epidemiological information. If result is POSITIVE SARS-CoV-2 target nucleic acids are DETECTED. The SARS-CoV-2 RNA is generally detectable in upper and lower   respiratory specimens dur ing the acute phase of infection.  Positive  results are indicative of active infection with SARS-CoV-2.  Clinical  correlation with patient history and other diagnostic information is  necessary to determine patient infection status.  Positive results do  not rule out bacterial infection or co-infection with other viruses. If result is PRESUMPTIVE POSTIVE SARS-CoV-2 nucleic acids MAY BE PRESENT.   A presumptive positive result was obtained on the submitted specimen  and confirmed on repeat testing.  While 2019 novel coronavirus  (SARS-CoV-2) nucleic acids may be present in the submitted sample  additional confirmatory testing may be necessary for epidemiological  and / or clinical management purposes  to differentiate between  SARS-CoV-2 and other Sarbecovirus currently known to infect humans.  If clinically indicated additional testing with an alternate test  methodology [phone]) is advised. The SARS-CoV-2 RNA is generally  detectable in upper and lower respiratory sp ecimens during the acute  phase of infection. The expected result is Negative. Fact Sheet for Patients:  StrictlyIdeas.no Fact Sheet for Healthcare Providers: BankingDealers.co.za This test is not yet approved or cleared by the [place] FDA and has been authorized for detection and/or diagnosis of SARS-CoV-2 by FDA under an Emergency Use Authorization (EUA).  This EUA will remain in effect (meaning this test can be used) for the duration of the COVID-19 declaration under Section 564(b)(1) of the Act, 21 U.S.C. section 360bbb-3(b)(1), unless the authorization is terminated or revoked sooner. Performed at [facility], [address] [place], [place] [address]   Hemoglobin A1c     Status: Abnormal   Collection Time: 08/21/18  3:55 AM  Result Value Ref Range   Hgb A1c MFr Bld 7.3 (H) 4.8 - 5.6 %    Comment: (NOTE) Pre  diabetes:          5.7%-6.4% Diabetes:              >  6.4% Glycemic control for   <7.0% adults with diabetes    Mean Plasma Glucose 162.81 mg/dL    Comment: Performed at [facility] [address] [place], [place] [address]  Lipid panel     Status: Abnormal   Collection Time: 08/21/18  3:55 AM  Result Value Ref Range   Cholesterol 115 0 - 200 mg/dL   Triglycerides 49 <150 mg/dL   HDL 40 (L) >40 mg/dL   Total CHOL/HDL Ratio 2.9 RATIO   VLDL 10 0 - 40 mg/dL   LDL Cholesterol 65 0 - 99 mg/dL    Comment:        Total Cholesterol/HDL:CHD Risk Coronary Heart Disease Risk Table                     Men   Women  1/2 Average Risk   3.4   3.3  Average Risk       5.0   4.4  2 X Average Risk   9.6   7.1  3 X Average Risk  23.4   11.0        Use the calculated Patient Ratio above and the CHD Risk Table to determine the patient's CHD Risk.        ATP III CLASSIFICATION (LDL):  <100     mg/dL   Optimal  100-129  mg/dL   Near or Above                    Optimal  130-159  mg/dL   Borderline  160-189  mg/dL   High  >190     mg/dL   Very High Performed at [facility], [address] [place], [place] [address]     Recent Results (from the past 240 hour(s))  SARS Coronavirus 2 (CEPHEID - Performed in [place] hospital lab), Hosp Order     Status: None   Collection Time: 08/20/18  3:24 PM   Specimen: Nasopharyngeal Swab  Result Value Ref Range Status   SARS Coronavirus 2 NEGATIVE NEGATIVE Final    Comment: (NOTE) If result is NEGATIVE SARS-CoV-2 target nucleic acids are NOT DETECTED. The SARS-CoV-2 RNA is generally detectable in upper and lower  respiratory specimens during the acute phase of infection. The lowest  concentration of SARS-CoV-2 viral copies this assay can detect is 250  copies / mL. A negative result does not preclude SARS-CoV-2 infection  and should not be used as the sole basis for treatment or other  patient management decisions.  A negative  result may occur with  improper specimen collection / handling, submission of specimen other  than nasopharyngeal swab, presence of viral mutation(s) within the  areas targeted by this assay, and inadequate number of viral copies  (<250 copies / mL). A negative result must be combined with clinical  observations, patient history, and epidemiological information. If result is POSITIVE SARS-CoV-2 target nucleic acids are DETECTED. The SARS-CoV-2 RNA is generally detectable in upper and lower  respiratory specimens dur ing the acute phase of infection.  Positive  results are indicative of active infection with SARS-CoV-2.  Clinical  correlation with patient history and other diagnostic information is  necessary to determine patient infection status.  Positive results do  not rule out bacterial infection or co-infection with other viruses. If result is PRESUMPTIVE POSTIVE SARS-CoV-2 nucleic acids MAY BE PRESENT.   A presumptive positive result was obtained on the submitted specimen  and confirmed on repeat  testing.  While 2019 novel coronavirus  (SARS-CoV-2) nucleic acids may be present in the submitted sample  additional confirmatory testing may be necessary for epidemiological  and / or clinical management purposes  to differentiate between  SARS-CoV-2 and other Sarbecovirus currently known to infect humans.  If clinically indicated additional testing with an alternate test  methodology [phone]) is advised. The SARS-CoV-2 RNA is generally  detectable in upper and lower respiratory sp ecimens during the acute  phase of infection. The expected result is Negative. Fact Sheet for Patients:  StrictlyIdeas.no Fact Sheet for Healthcare Providers: BankingDealers.co.za This test is not yet approved or cleared by the [place] FDA and has been authorized for detection and/or diagnosis of SARS-CoV-2 by FDA under an Emergency Use Authorization  (EUA).  This EUA will remain in effect (meaning this test can be used) for the duration of the COVID-19 declaration under Section 564(b)(1) of the Act, 21 U.S.C. section 360bbb-3(b)(1), unless the authorization is terminated or revoked sooner. Performed at [facility], [address], [place], [place] [address]     Mr Brain TS Contrast  Result Date: 08/20/2018 CLINICAL DATA:  Altered mental status. Disorientation. Symptoms began 3 days ago. EXAM: MRI HEAD WITHOUT CONTRAST TECHNIQUE: Multiplanar, multiecho pulse sequences of the brain and surrounding structures were obtained without intravenous contrast. COMPARISON:  02/04/2016 FINDINGS: Brain: Diffusion imaging shows patchy areas of acute infarction in the right parietal lobe consistent with right MCA branch vessel territory infarction. Low level petechial blood products present in some of the areas of involvement. No frank hematoma. Mild brain swelling but no mass effect or shift. Elsewhere, there are mild chronic small-vessel ischemic changes of the hemispheric white matter. No sign of mass lesion, hydrocephalus or extra-axial collection. Vascular: Major vessels at the base of the brain show flow. Skull and upper cervical spine: Negative Sinuses/Orbits: Mild mucosal thickening of the paranasal sinuses. Previous functional endoscopic sinus surgery. Orbits negative. Other: None IMPRESSION: Areas of acute infarction in the right parietal lobe consistent with right MCA branch vessel infarction. Mild swelling. Some petechial blood products, particularly along the posterior areas of involvement. No frank hematoma. No mass effect. Mild chronic small-vessel ischemic change elsewhere. Electronically Signed   By: Nelson Chimes M.D.   On: 08/20/2018 14:29     Assessment/Plan: 73 y/o with asthma, HTN, diabetes in whom MRI showing a right parietal stroke with hgic conversion. Neuro exam is none focal.  Recs: - Neuroprotective measures inclduing  normothermia, normoglycemia, correct electrolytes/metabolic abnlities - obtain Echo, US carotid doppler - Would hold on ASA/blood thinner/anti platelets for now due to hgic conversion. Will restart in about a week to 10 days - Control of BP to less or equal to 140 due to hgic conversion - PT/OT    08/21/2018, 10:03 AM

## 2018-08-21 NOTE — Evaluation (Signed)
Occupational Therapy Evaluation Patient Details Name: Jason Doyle MRN: [id number] DOB: [birth year] Today's Date: 08/21/2018    History of Present Illness Pt is a 73 y.o. male presenting to hospital 08/20/18 with memory disturbance and forgetfullness x3 days; OP MRI (+) CVA.  Imaging showing R parietal lobe acute infarct (some petechial blood products, particularly along posterior areas of involvement; no mass effect).  PMH includes ankylosing spondylitis, DM, htn, asthma, L RCR.   Clinical Impression   Pt seen for OT evaluation this date. Prior to hospital admission, pt was independent in all aspects of ADL/IADL and mobility. Pt lives with his spouse and denies hx of falls. Currently pt reporting symptoms have resolved. Pt demonstrates baseline independence to perform ADL and mobility tasks and no strength, sensory, coordination, cognitive, or visual deficits appreciated with assessment. No skilled OT needs identified. Will sign off. Please re-consult if additional OT needs arise.    Follow Up Recommendations  No OT follow up    Equipment Recommendations  None recommended by OT    Recommendations for Other Services       Precautions / Restrictions Precautions Precautions: Fall Restrictions Weight Bearing Restrictions: No      Mobility Bed Mobility Overal bed mobility: Independent             General bed mobility comments: No difficulties noted.  Transfers Overall transfer level: Independent Equipment used: None             General transfer comment: no difficulties noted    Balance Overall balance assessment: Independent                                         ADL either performed or assessed with clinical judgement   ADL Overall ADL's : Independent                                             Vision Patient Visual Report: No change from baseline Vision Assessment?: No apparent visual deficits     Perception      Praxis      Pertinent Vitals/Pain Pain Assessment: No/denies pain     Hand Dominance     Extremity/Trunk Assessment Upper Extremity Assessment Upper Extremity Assessment: Overall WFL for tasks assessed   Lower Extremity Assessment Lower Extremity Assessment: Overall WFL for tasks assessed RLE Deficits / Details: hip flexion, knee flexion/extension, and DF 5/5; able to perform SLR independently(intact B LE sensation, tone, proprioception, and heel to shin coordination) LLE Deficits / Details: hip flexion, knee flexion/extension, and DF 5/5; able to perform SLR independently   Cervical / Trunk Assessment Cervical / Trunk Assessment: Normal   Communication Communication Communication: No difficulties   Cognition Arousal/Alertness: Awake/alert Behavior During Therapy: WFL for tasks assessed/performed Overall Cognitive Status: Within Functional Limits for tasks assessed                                 General Comments: No memory, visual spatial, or disorientation noted   General Comments       Exercises Other Exercises Other Exercises: Pt educated in s/s of CVA, pt verbalized understanding   Shoulder Instructions      Home Living Family/patient expects to be discharged to::  Private residence Living Arrangements: Spouse/significant other Available Help at Discharge: Family Type of Home: House Home Access: Stairs to enter Technical brewer of Steps: 1 Entrance Stairs-Rails: None Home Layout: Two level;Bed/bath upstairs Alternate Level Stairs-Number of Steps: 10 with L railing plus 6 with R railing   Bathroom Shower/Tub: Occupational psychologist: Handicapped height     Home Equipment: None          Prior Functioning/Environment Level of Independence: Independent        Comments: Pt reports no falls in past 6 months.        OT Problem List:        OT Treatment/Interventions:      OT Goals(Current goals can be found in the  care plan section) Acute Rehab OT Goals Patient Stated Goal: to go home OT Goal Formulation: All assessment and education complete, DC therapy  OT Frequency:     Barriers to D/C:            Co-evaluation              AM-PAC OT "6 Clicks" Daily Activity     Outcome Measure Help from another person eating meals?: None Help from another person taking care of personal grooming?: None Help from another person toileting, which includes using toliet, bedpan, or urinal?: None Help from another person bathing (including washing, rinsing, drying)?: None Help from another person to put on and taking off regular upper body clothing?: None Help from another person to put on and taking off regular lower body clothing?: None 6 Click Score: 24   End of Session    Activity Tolerance: Patient tolerated treatment well Patient left: in bed;with call bell/phone within reach;Other (comment)(with transport staff preparing to take for imaging)  OT Visit Diagnosis: Other abnormalities of gait and mobility (R26.89)                Time: 1000-1018 OT Time Calculation (min): 18 min Charges:  OT General Charges $OT Visit: 1 Visit OT Evaluation $OT Eval Low Complexity: 1 Low  Jeni Salles, MPH, MS, OTR/L ascom [phone] 08/21/18, 12:00 PM

## 2018-09-05 ENCOUNTER — Other Ambulatory Visit: Payer: Self-pay

## 2018-09-05 ENCOUNTER — Emergency Department: Payer: Medicare Other

## 2018-09-05 ENCOUNTER — Emergency Department
Admission: EM | Admit: 2018-09-05 | Discharge: 2018-09-05 | Disposition: A | Discharge status: Home / Self Care | Payer: Medicare Other | Attending: Emergency Medicine | Admitting: Emergency Medicine

## 2018-09-05 ENCOUNTER — Encounter: Payer: Self-pay | Admitting: Emergency Medicine

## 2018-09-05 DIAGNOSIS — Z8673 Personal history of transient ischemic attack (TIA), and cerebral infarction without residual deficits: Secondary | ICD-10-CM | POA: Diagnosis not present

## 2018-09-05 DIAGNOSIS — E119 Type 2 diabetes mellitus without complications: Secondary | ICD-10-CM | POA: Diagnosis not present

## 2018-09-05 DIAGNOSIS — R202 Paresthesia of skin: Secondary | ICD-10-CM | POA: Insufficient documentation

## 2018-09-05 DIAGNOSIS — R200 Anesthesia of skin: Secondary | ICD-10-CM | POA: Diagnosis present

## 2018-09-05 DIAGNOSIS — Z7984 Long term (current) use of oral hypoglycemic drugs: Secondary | ICD-10-CM | POA: Diagnosis not present

## 2018-09-05 DIAGNOSIS — J45909 Unspecified asthma, uncomplicated: Secondary | ICD-10-CM | POA: Diagnosis not present

## 2018-09-05 DIAGNOSIS — Z79899 Other long term (current) drug therapy: Secondary | ICD-10-CM | POA: Diagnosis not present

## 2018-09-05 LAB — COMPREHENSIVE METABOLIC PANEL
ALT: 37 U/L (ref 0–44)
AST: 39 U/L (ref 15–41)
Albumin: 3.9 g/dL (ref 3.5–5.0)
Alkaline Phosphatase: 111 U/L (ref 38–126)
Anion gap: 9 (ref 5–15)
BUN: 15 mg/dL (ref 8–23)
CO2: 26 mmol/L (ref 22–32)
Calcium: 8.7 mg/dL — ABNORMAL LOW (ref 8.9–10.3)
Chloride: 102 mmol/L (ref 98–111)
Creatinine, Ser: 1.34 mg/dL — ABNORMAL HIGH (ref 0.61–1.24)
GFR calc Af Amer: >60 mL/min (ref >60)
GFR calc non Af Amer: 53 mL/min — ABNORMAL LOW (ref >60)
Glucose, Bld: 231 mg/dL — ABNORMAL HIGH (ref 70–99)
Potassium: 3.9 mmol/L (ref 3.5–5.1)
Sodium: 137 mmol/L (ref 135–145)
Total Bilirubin: 0.9 mg/dL (ref 0.3–1.2)
Total Protein: 7 g/dL (ref 6.5–8.1)

## 2018-09-05 LAB — CBC WITH DIFFERENTIAL/PLATELET
Abs Immature Granulocytes: 0.01 10*3/uL (ref 0.00–0.07)
Basophils Absolute: 0 10*3/uL (ref 0.0–0.1)
Basophils Relative: 0 %
Eosinophils Absolute: 0 10*3/uL (ref 0.0–0.5)
Eosinophils Relative: 0 %
HCT: 48.8 % (ref 39.0–52.0)
Hemoglobin: 15.8 g/dL (ref 13.0–17.0)
Immature Granulocytes: 0 %
Lymphocytes Relative: 38 %
Lymphs Abs: 2.3 10*3/uL (ref 0.7–4.0)
MCH: 29.5 pg (ref 26.0–34.0)
MCHC: 32.4 g/dL (ref 30.0–36.0)
MCV: 91 fL (ref 80.0–100.0)
Monocytes Absolute: 0.5 10*3/uL (ref 0.1–1.0)
Monocytes Relative: 8 %
Neutro Abs: 3.3 10*3/uL (ref 1.7–7.7)
Neutrophils Relative %: 54 %
Platelets: 163 10*3/uL (ref 150–400)
RBC: 5.36 MIL/uL (ref 4.22–5.81)
RDW: 13.5 % (ref 11.5–15.5)
WBC: 6.1 10*3/uL (ref 4.0–10.5)
nRBC: 0 % (ref 0.0–0.2)

## 2018-09-05 MED ORDER — GADOBUTROL 1 MMOL/ML IV SOLN
9.0000 mL | Freq: Once | INTRAVENOUS | Status: AC | PRN
  Administered 2018-09-05: 9 mL via INTRAVENOUS

## 2018-09-05 NOTE — ED Triage Notes (Signed)
PT arrives with complaints of left arm numbness. Pt states he went to bed last night at 2300 with no numbness. Pt states he woke up today at 0600 and had numbness in his left arm. Pt states that sensation is no longer present. Pt reports concern due to hx of stroke. Pt states he also feels like his left eye "felt fuzzy." Pt denies any pain. In Triage pt a& o x 4; grips equal and strong.

## 2018-09-05 NOTE — Discharge Instructions (Addendum)
Your CT and MRI scans today did not show any worrisome changes. Continue taking your medicine and follow up with neurology as scheduled on Monday.

## 2018-09-05 NOTE — ED Provider Notes (Signed)
[facility] Emergency Department Provider Note  ____________________________________________  Time seen: Approximately 2:35 PM  I have reviewed the triage vital signs and the nursing notes.   HISTORY  Chief Complaint Numbness (left arm)    HPI Jason Doyle is a 73 y.o. male with a history of BPH diabetes hypertension and a recent right parietal stroke who reports new left arm numbness that started last night sometime while sleeping.  He woke up with the symptoms.  No weakness.  No new headache or vision change falls or trauma.  No leg weakness or paresthesia.  Symptoms are constant without aggravating or alleviating factors.  No radiating pain.  During his recent hospitalization he was increased to full dose aspirin and has a follow-up appointment with neurology on Monday, July 27.  He has seen his cardiologist as well since being discharged from the hospital.     Past Medical History:  Diagnosis Date  . Ankylosing spondylitis ([place])   . Arthritis    knee,hands,back and neck  . Asthma    LAST ATTACK 4-5 YRS AGO  . BPH (benign prostatic hyperplasia)   . Diabetes mellitus without complication ([place])    TYPE 2  . Gout   . H/O multiple allergies    SEASONAL,ENVIRONMENTAL  . H/O sinus surgery    x4  . Headache    sinus  . Hypertension   . Rotator cuff tear    left shoulder  . Sleep apnea    C-PAP     Patient Active Problem List   Diagnosis Date Noted  . Stroke (cerebrum) ([place]) 08/20/2018     Past Surgical History:  Procedure Laterality Date  . BICEPT TENODESIS Left 07/23/2014   Procedure: BICEPS TENODESIS;  Surgeon: [name], MD;  Location: [facility];  Service: Orthopedics;  Laterality: Left;  . COLONOSCOPY WITH PROPOFOL N/A 04/10/2017   Procedure: COLONOSCOPY WITH PROPOFOL;  Surgeon: [name], MD;  Location: [facility] ENDOSCOPY;  Service: Endoscopy;  Laterality: N/A;  . KNEE ARTHROSCOPY    . NASAL SINUS SURGERY     x 4   . SHOULDER ARTHROSCOPY WITH ROTATOR CUFF REPAIR AND SUBACROMIAL DECOMPRESSION Left 07/23/2014   Procedure: SHOULDER ARTHROSCOPY WITH ROTATOR CUFF REPAIR AND SUBACROMIAL DECOMPRESSION;  Surgeon: [name], MD;  Location: [facility];  Service: Orthopedics;  Laterality: Left;  . UVULOPALATOPHARYNGOPLASTY       Prior to Admission medications   Medication Sig Start Date End Date Taking? Authorizing Provider  acetaminophen (TYLENOL) 500 MG tablet Take 500 mg by mouth every 6 (six) hours.    [provider]  albuterol (PROVENTIL HFA;VENTOLIN HFA) 108 (90 BASE) MCG/ACT inhaler Inhale 2 puffs into the lungs as needed for wheezing or shortness of breath.    [provider]  allopurinol (ZYLOPRIM) 100 MG tablet Take 100 mg by mouth daily. am    [provider]  amLODipine (NORVASC) 5 MG tablet Take 5 mg by mouth daily. am    [provider]  aspirin 81 MG chewable tablet Chew 1 tablet (81 mg total) by mouth daily. Start on 08/31/2018 no earlier then that please thank you 08/31/18   Bettey Costa, MD  atorvastatin (LIPITOR) 80 MG tablet Take 80 mg by mouth daily.    [provider]  Azelastine-Fluticasone 137-50 MCG/ACT SUSP Place 1 spray into the nose 2 (two) times a day. 03/17/15   [provider]  budesonide (PULMICORT) 0.5 MG/2ML nebulizer solution Place 10 mLs into both  nostrils 2 (two) times a day. Add 10 ml (one dose) of medication to 240 ml of saline in sinus rinse bottle. Irrigate sinuses with 120 ml through each nostril twice daily 04/23/18   [provider]  budesonide-formoterol (SYMBICORT) 160-4.5 MCG/ACT inhaler Inhale 2 puffs into the lungs daily. am    [provider]  Cholecalciferol (VITAMIN D) 2000 UNITS tablet Take 2,000 Units by mouth daily. am    [provider]  etanercept (ENBREL) 50 MG/ML injection Inject 50 mg into the skin once a week.    [provider]  glipiZIDE (GLUCOTROL XL) 2.5 MG 24  hr tablet Take 2.5 mg by mouth daily with breakfast.    [provider]  lisinopril (PRINIVIL,ZESTRIL) 40 MG tablet Take 40 mg by mouth daily.    [provider]  loratadine (CLARITIN) 10 MG tablet Take 10 mg by mouth daily.    [provider]  montelukast (SINGULAIR) 10 MG tablet Take 10 mg by mouth at bedtime.    [provider]  oxybutynin (DITROPAN-XL) 5 MG 24 hr tablet Take 5 mg by mouth daily.    [provider]  tamsulosin (FLOMAX) 0.4 MG CAPS capsule Take 0.4 mg by mouth daily after breakfast.    [provider]  tiZANidine (ZANAFLEX) 4 MG capsule Take 4 mg by mouth daily. Pm    [provider]  valsartan (DIOVAN) 320 MG tablet Take 320 mg by mouth daily. Am    [provider]     Allergies Patient has no known allergies.   No family history on file.  Social History Social History   Tobacco Use  . Smoking status: Never Smoker  . Smokeless tobacco: Never Used  Substance Use Topics  . Alcohol use: Yes    Comment: RARELY  . Drug use: Not on file    Review of Systems  Constitutional:   No fever or chills.  ENT:   No sore throat. No rhinorrhea. Cardiovascular:   No chest pain or syncope. Respiratory:   No dyspnea or cough. Gastrointestinal:   Negative for abdominal pain, vomiting and diarrhea.  Musculoskeletal:   Negative for focal pain or swelling All other systems reviewed and are negative except as documented above in ROS and HPI.  ____________________________________________   PHYSICAL EXAM:  VITAL SIGNS: ED Triage Vitals [09/05/18 0935]  Enc Vitals Group     BP (!) 169/93     Pulse Rate 97     Resp 19     Temp 98.2 F (36.8 C)     Temp Source Oral     SpO2 97 %     Weight 255 lb (115.7 kg)     Height 6\' 2"  (1.88 m)     Head Circumference      Peak Flow      Pain Score 0     Pain Loc      Pain Edu?      Excl. in [place]?     Vital signs reviewed, nursing assessments  reviewed.   Constitutional:   Alert and oriented. Non-toxic appearance. Eyes:   Conjunctivae are normal. EOMI. PERRL. ENT      Head:   Normocephalic and atraumatic.      Nose:   No congestion/rhinnorhea.       Mouth/Throat:   MMM, no pharyngeal erythema. No peritonsillar mass.       Neck:   No meningismus. Full ROM. Hematological/Lymphatic/Immunilogical:   No cervical lymphadenopathy. Cardiovascular:   RRR.  Symmetric bilateral radial and DP pulses.  No murmurs. Cap refill less than 2 seconds. Respiratory:   Normal respiratory effort without tachypnea/retractions. Breath sounds are clear and equal bilaterally. No wheezes/rales/rhonchi. Gastrointestinal:   Soft and nontender. Non distended. There is no CVA tenderness.  No rebound, rigidity, or guarding.  Musculoskeletal:   Normal range of motion in all extremities. No joint effusions.  No lower extremity tenderness.  No edema. Neurologic:   Normal speech and language.  Motor grossly intact. Left upper extremity sensory deficit NIH stroke scale 1 Skin:    Skin is warm, dry and intact. No rash noted.  No petechiae, purpura, or bullae.  ____________________________________________    LABS (pertinent positives/negatives) (all labs ordered are listed, but only abnormal results are displayed) Labs Reviewed  COMPREHENSIVE METABOLIC PANEL - Abnormal; Notable for the following components:      Result Value   Glucose, Bld 231 (*)    Creatinine, Ser 1.34 (*)    Calcium 8.7 (*)    GFR calc non Af Amer 53 (*)    All other components within normal limits  CBC WITH DIFFERENTIAL/PLATELET  PROTIME-INR  APTT   ____________________________________________   EKG    ____________________________________________    RADIOLOGY  Ct Head Wo Contrast  Result Date: 09/05/2018 CLINICAL DATA:  Left forearm tingling. EXAM: CT HEAD WITHOUT CONTRAST TECHNIQUE: Contiguous axial images were obtained from the base of the skull through the vertex  without intravenous contrast. COMPARISON:  MRI 08/20/2018 FINDINGS: Brain: changes of infarct noted in the right parietal lobe as seen on prior MRI. No new areas of infarction or hemorrhage. No hydrocephalus. No midline shift. Vascular: No hyperdense vessel or unexpected calcification. Skull: No acute calvarial abnormality. Sinuses/Orbits: Postoperative changes in the sinuses. No acute findings. Other: None IMPRESSION: Changes of infarct in the right parietal lobe as seen on recent MRI. No additional infarct. No new acute intracranial abnormality. Electronically Signed   By: Rolm Baptise M.D.   On: 09/05/2018 12:56    ____________________________________________   PROCEDURES Procedures  ____________________________________________  DIFFERENTIAL DIAGNOSIS   Intracranial hemorrhagic conversion, new ischemic stroke, vascular occlusion  CLINICAL IMPRESSION / ASSESSMENT AND PLAN / ED COURSE  Medications ordered in the ED: Medications  gadobutrol (GADAVIST) 1 MMOL/ML injection 9 mL (has no administration in time range)    Pertinent labs & imaging results that were available during my care of the patient were reviewed by me and considered in my medical decision making (see chart for details).  Jason Doyle was evaluated in Emergency Department on 09/05/2018 for the symptoms described in the history of present illness. He was evaluated in the context of the global COVID-19 pandemic, which necessitated consideration that the patient might be at risk for infection with the SARS-CoV-2 virus that causes COVID-19. Institutional protocols and algorithms that pertain to the evaluation of patients at risk for COVID-19 are in a state of rapid change based on information released by regulatory bodies including the CDC and federal and state organizations. These policies and algorithms were followed during the patient's care in the ED.   Patient presents with new left arm paresthesia in the setting of recent  right parietal stroke.  This may be evolving changes of his previous infarct, with a new symptoms will need repeat CT head and MRI brain today.  If negative I think he can discharge home to continue his aspirin and follow-up with neurology in 5 days as already scheduled.      ____________________________________________  FINAL CLINICAL IMPRESSION(S) / ED DIAGNOSES    Final diagnoses:  Paresthesia of left arm     ED Discharge Orders    None      Portions of this note were generated with dragon dictation software. Dictation errors may occur despite best attempts at proofreading.   Carrie Mew, MD 09/05/18 [phone]

## 2018-09-05 NOTE — ED Notes (Signed)
Patient transported to MRI 

## 2018-09-05 NOTE — Care Management (Addendum)
Previous referral to [facility] home health per note 08/21/18.  Message left for [place] with [facility]. Updat at 1104A: [facility] closed services with this patient on 02/20/2018. Patient declined home health per [place] on last referral.

## 2018-09-14 DIAGNOSIS — Z8673 Personal history of transient ischemic attack (TIA), and cerebral infarction without residual deficits: Secondary | ICD-10-CM

## 2018-11-08 DIAGNOSIS — D849 Immunodeficiency, unspecified: Secondary | ICD-10-CM | POA: Insufficient documentation

## 2018-11-16 ENCOUNTER — Other Ambulatory Visit: Payer: Medicare Other | Attending: Cardiology

## 2018-11-19 ENCOUNTER — Other Ambulatory Visit
Admission: RE | Admit: 2018-11-19 | Discharge: 2018-11-19 | Disposition: A | Discharge status: Home / Self Care | Payer: Medicare Other | Source: Ambulatory Visit | Attending: Internal Medicine | Admitting: Internal Medicine

## 2018-11-19 ENCOUNTER — Other Ambulatory Visit: Payer: Self-pay

## 2018-11-19 DIAGNOSIS — Z01812 Encounter for preprocedural laboratory examination: Secondary | ICD-10-CM | POA: Diagnosis present

## 2018-11-19 DIAGNOSIS — Z20828 Contact with and (suspected) exposure to other viral communicable diseases: Secondary | ICD-10-CM | POA: Diagnosis not present

## 2018-11-19 LAB — SARS CORONAVIRUS 2 (TAT 6-24 HRS): SARS Coronavirus 2: NEGATIVE

## 2018-11-20 ENCOUNTER — Ambulatory Visit
Admission: RE | Admit: 2018-11-20 | Discharge: 2018-11-20 | Disposition: A | Discharge status: Home / Self Care | Payer: Medicare Other | Attending: Cardiology | Admitting: Cardiology

## 2018-11-20 ENCOUNTER — Encounter
Admission: RE | Disposition: A | Discharge status: Home / Self Care | Payer: Self-pay | Source: Home / Self Care | Attending: Cardiology

## 2018-11-20 DIAGNOSIS — J45909 Unspecified asthma, uncomplicated: Secondary | ICD-10-CM | POA: Insufficient documentation

## 2018-11-20 DIAGNOSIS — Z823 Family history of stroke: Secondary | ICD-10-CM | POA: Diagnosis not present

## 2018-11-20 DIAGNOSIS — M459 Ankylosing spondylitis of unspecified sites in spine: Secondary | ICD-10-CM | POA: Diagnosis not present

## 2018-11-20 DIAGNOSIS — Z7982 Long term (current) use of aspirin: Secondary | ICD-10-CM | POA: Diagnosis not present

## 2018-11-20 DIAGNOSIS — M1990 Unspecified osteoarthritis, unspecified site: Secondary | ICD-10-CM | POA: Insufficient documentation

## 2018-11-20 DIAGNOSIS — I4891 Unspecified atrial fibrillation: Secondary | ICD-10-CM | POA: Diagnosis not present

## 2018-11-20 DIAGNOSIS — Z7951 Long term (current) use of inhaled steroids: Secondary | ICD-10-CM | POA: Diagnosis not present

## 2018-11-20 DIAGNOSIS — Z8673 Personal history of transient ischemic attack (TIA), and cerebral infarction without residual deficits: Secondary | ICD-10-CM | POA: Insufficient documentation

## 2018-11-20 DIAGNOSIS — Z7984 Long term (current) use of oral hypoglycemic drugs: Secondary | ICD-10-CM | POA: Insufficient documentation

## 2018-11-20 DIAGNOSIS — M109 Gout, unspecified: Secondary | ICD-10-CM | POA: Diagnosis not present

## 2018-11-20 DIAGNOSIS — Z79899 Other long term (current) drug therapy: Secondary | ICD-10-CM | POA: Diagnosis not present

## 2018-11-20 DIAGNOSIS — N400 Enlarged prostate without lower urinary tract symptoms: Secondary | ICD-10-CM | POA: Diagnosis not present

## 2018-11-20 DIAGNOSIS — I10 Essential (primary) hypertension: Secondary | ICD-10-CM | POA: Insufficient documentation

## 2018-11-20 DIAGNOSIS — E785 Hyperlipidemia, unspecified: Secondary | ICD-10-CM | POA: Insufficient documentation

## 2018-11-20 DIAGNOSIS — E1142 Type 2 diabetes mellitus with diabetic polyneuropathy: Secondary | ICD-10-CM | POA: Insufficient documentation

## 2018-11-20 DIAGNOSIS — E559 Vitamin D deficiency, unspecified: Secondary | ICD-10-CM | POA: Diagnosis not present

## 2018-11-20 DIAGNOSIS — G4730 Sleep apnea, unspecified: Secondary | ICD-10-CM | POA: Diagnosis not present

## 2018-11-20 DIAGNOSIS — Z888 Allergy status to other drugs, medicaments and biological substances status: Secondary | ICD-10-CM | POA: Insufficient documentation

## 2018-11-20 HISTORY — PX: LOOP RECORDER INSERTION: EP1214

## 2018-11-20 HISTORY — DX: Cerebral infarction, unspecified: I63.9

## 2018-11-20 LAB — GLUCOSE, CAPILLARY: Glucose-Capillary: 109 mg/dL — ABNORMAL HIGH (ref 70–99)

## 2018-11-20 SURGERY — LOOP RECORDER INSERTION
Anesthesia: LOCAL

## 2018-11-20 SURGICAL SUPPLY — 2 items
LOOP REVEAL LINQSYS (Prosthesis & Implant Heart) ×1 IMPLANT
PACK LOOP INSERTION (CUSTOM PROCEDURE TRAY) ×1 IMPLANT

## 2018-11-20 NOTE — Discharge Instructions (Signed)
Implantable Loop Recorder Placement, Care After This sheet gives you information about how to care for yourself after your procedure. Your health care provider may also give you more specific instructions. If you have problems or questions, contact your health care provider. What can I expect after the procedure? After the procedure, it is common to have:  Soreness or discomfort near the incision.  Some swelling or bruising near the incision. Follow these instructions at home: Incision care   Follow instructions from your health care provider about how to take care of your incision. Make sure you: ? Wash your hands with soap and water before you change your bandage (dressing). If soap and water are not available, use hand sanitizer. ? Change your dressing as told by your health care provider. ? Keep your dressing dry. ? Leave stitches (sutures), skin glue, or adhesive strips in place. These skin closures may need to stay in place for 2 weeks or longer. If adhesive strip edges start to loosen and curl up, you may trim the loose edges. Do not remove adhesive strips completely unless your health care provider tells you to do that.  Check your incision area every day for signs of infection. Check for: ? Redness, swelling, or pain. ? Fluid or blood. ? Warmth. ? Pus or a bad smell.  Do not take baths, swim, or use a hot tub until your health care provider approves. Ask your health care provider if you can take showers. Activity   Return to your normal activities as told by your health care provider. Ask your health care provider what activities are safe for you.  Do not drive for 24 hours if you were given a sedative during your procedure. General instructions  Follow instructions from your health care provider about how to manage your implantable loop recorder and transmit the information. Learn how to activate a recording if this is necessary for your type of device.  Do not go through  a metal detection gate, and do not let someone hold a metal detector over your chest. Show your ID card.  Do not have an MRI unless you check with your health care provider first.  Take over-the-counter and prescription medicines only as told by your health care provider.  Keep all follow-up visits as told by your health care provider. This is important. Contact a health care provider if:  You have redness, swelling, or pain around your incision.  You have a fever.  You have pain that is not relieved by your pain medicine.  You have triggered your device because of fainting (syncope) or because of a heartbeat that feels like it is racing, slow, fluttering, or skipping (palpitations). Get help right away if you have:  Chest pain.  Difficulty breathing. Summary  After the procedure, it is common to have soreness or discomfort near the incision.  Change your dressing as told by your health care provider.  Follow instructions from your health care provider about how to manage your implantable loop recorder and transmit the information.  Keep all follow-up visits as told by your health care provider. This is important. This information is not intended to replace advice given to you by your health care provider. Make sure you discuss any questions you have with your health care provider. Document Released: 01/12/2015 Document Revised: 03/18/2017 Document Reviewed: 03/18/2017 Elsevier Patient Education  2020 [employer].

## 2018-11-20 NOTE — Progress Notes (Signed)
No orders for pre-procedure. Dr. Saralyn Pilar notified, no response.

## 2019-01-09 ENCOUNTER — Encounter: Payer: Self-pay | Admitting: Emergency Medicine

## 2019-01-09 ENCOUNTER — Emergency Department: Payer: Medicare Other

## 2019-01-09 ENCOUNTER — Other Ambulatory Visit: Payer: Self-pay

## 2019-01-09 ENCOUNTER — Emergency Department
Admission: EM | Admit: 2019-01-09 | Discharge: 2019-01-09 | Disposition: A | Discharge status: Home / Self Care | Payer: Medicare Other | Attending: Internal Medicine | Admitting: Internal Medicine

## 2019-01-09 DIAGNOSIS — I10 Essential (primary) hypertension: Secondary | ICD-10-CM | POA: Insufficient documentation

## 2019-01-09 DIAGNOSIS — Z7982 Long term (current) use of aspirin: Secondary | ICD-10-CM | POA: Insufficient documentation

## 2019-01-09 DIAGNOSIS — Z79899 Other long term (current) drug therapy: Secondary | ICD-10-CM | POA: Insufficient documentation

## 2019-01-09 DIAGNOSIS — J45909 Unspecified asthma, uncomplicated: Secondary | ICD-10-CM | POA: Diagnosis not present

## 2019-01-09 DIAGNOSIS — R42 Dizziness and giddiness: Secondary | ICD-10-CM | POA: Insufficient documentation

## 2019-01-09 DIAGNOSIS — I472 Ventricular tachycardia, unspecified: Secondary | ICD-10-CM

## 2019-01-09 DIAGNOSIS — E119 Type 2 diabetes mellitus without complications: Secondary | ICD-10-CM | POA: Diagnosis not present

## 2019-01-09 DIAGNOSIS — I4891 Unspecified atrial fibrillation: Secondary | ICD-10-CM | POA: Insufficient documentation

## 2019-01-09 DIAGNOSIS — Z8673 Personal history of transient ischemic attack (TIA), and cerebral infarction without residual deficits: Secondary | ICD-10-CM | POA: Insufficient documentation

## 2019-01-09 LAB — CBC
HCT: 51.6 % (ref 39.0–52.0)
Hemoglobin: 16.5 g/dL (ref 13.0–17.0)
MCH: 29.3 pg (ref 26.0–34.0)
MCHC: 32 g/dL (ref 30.0–36.0)
MCV: 91.7 fL (ref 80.0–100.0)
Platelets: 148 10*3/uL — ABNORMAL LOW (ref 150–400)
RBC: 5.63 MIL/uL (ref 4.22–5.81)
RDW: 14 % (ref 11.5–15.5)
WBC: 5 10*3/uL (ref 4.0–10.5)
nRBC: 0 % (ref 0.0–0.2)

## 2019-01-09 LAB — URINALYSIS, COMPLETE (UACMP) WITH MICROSCOPIC
Bacteria, UA: NONE SEEN
Bilirubin Urine: NEGATIVE
Glucose, UA: >=500 mg/dL — AB
Hgb urine dipstick: NEGATIVE
Ketones, ur: NEGATIVE mg/dL
Leukocytes,Ua: NEGATIVE
Nitrite: NEGATIVE
Protein, ur: NEGATIVE mg/dL
Specific Gravity, Urine: 1.011 (ref 1.005–1.030)
Squamous Epithelial / HPF: NONE SEEN (ref 0–5)
pH: 6 (ref 5.0–8.0)

## 2019-01-09 LAB — BASIC METABOLIC PANEL
Anion gap: 9 (ref 5–15)
BUN: 20 mg/dL (ref 8–23)
CO2: 26 mmol/L (ref 22–32)
Calcium: 8.9 mg/dL (ref 8.9–10.3)
Chloride: 105 mmol/L (ref 98–111)
Creatinine, Ser: 1.27 mg/dL — ABNORMAL HIGH (ref 0.61–1.24)
GFR calc Af Amer: >60 mL/min (ref >60)
GFR calc non Af Amer: 56 mL/min — ABNORMAL LOW (ref >60)
Glucose, Bld: 137 mg/dL — ABNORMAL HIGH (ref 70–99)
Potassium: 3.7 mmol/L (ref 3.5–5.1)
Sodium: 140 mmol/L (ref 135–145)

## 2019-01-09 LAB — TROPONIN I (HIGH SENSITIVITY)
Troponin I (High Sensitivity): 23 ng/L — ABNORMAL HIGH (ref <18)
Troponin I (High Sensitivity): 32 ng/L — ABNORMAL HIGH (ref <18)

## 2019-01-09 MED ORDER — HYDROCHLOROTHIAZIDE 25 MG PO TABS
12.5000 mg | ORAL_TABLET | Freq: Once | ORAL | Status: AC
  Administered 2019-01-09: 13:00:00 12.5 mg via ORAL
  Filled 2019-01-09: qty 1

## 2019-01-09 MED ORDER — ONDANSETRON HCL 4 MG/2ML IJ SOLN
4.0000 mg | Freq: Once | INTRAMUSCULAR | Status: AC
  Administered 2019-01-09: 4 mg via INTRAVENOUS
  Filled 2019-01-09: qty 2

## 2019-01-09 MED ORDER — LABETALOL HCL 100 MG PO TABS: 100.0000 mg | ORAL_TABLET | Freq: Two times a day (BID) | ORAL | 2 refills | Status: DC

## 2019-01-09 MED ORDER — AMLODIPINE BESYLATE 5 MG PO TABS
10.0000 mg | ORAL_TABLET | Freq: Once | ORAL | Status: AC
  Administered 2019-01-09: 13:00:00 10 mg via ORAL
  Filled 2019-01-09: qty 2

## 2019-01-09 MED ORDER — LISINOPRIL 10 MG PO TABS
40.0000 mg | ORAL_TABLET | Freq: Once | ORAL | Status: AC
  Administered 2019-01-09: 13:00:00 40 mg via ORAL
  Filled 2019-01-09: qty 4

## 2019-01-09 MED ORDER — APIXABAN 5 MG PO TABS: 5.0000 mg | ORAL_TABLET | Freq: Two times a day (BID) | ORAL | 2 refills | Status: DC

## 2019-01-09 MED ORDER — APIXABAN 5 MG PO TABS
5.0000 mg | ORAL_TABLET | Freq: Two times a day (BID) | ORAL | Status: DC
  Administered 2019-01-09: 13:00:00 5 mg via ORAL
  Filled 2019-01-09: qty 1

## 2019-01-09 MED ORDER — SODIUM CHLORIDE 0.9 % IV BOLUS
1000.0000 mL | Freq: Once | INTRAVENOUS | Status: AC
  Administered 2019-01-09: 1000 mL via INTRAVENOUS

## 2019-01-09 NOTE — ED Notes (Signed)
Pt states was watching the news last night around 11pm and began to feel dizzy. States woke up at 3 with same and began to feel nauseous. Denies numbness, tingling, blurred vision. States has a loop recorder. Takes asa. Stroke in July. A&O, moving all extremities on own. No slurred speech. Denies hx of vertigo.

## 2019-01-09 NOTE — ED Notes (Signed)
Pt ambulatory to toilet with steady gait noted. Wife at bedside. Pt given urine specimen cup and informed of need for urine sample.

## 2019-01-09 NOTE — ED Notes (Signed)
Rhythm strip printed at this time for cardiac rhythm change. Appears to have had 32 beat run of vtach. Rhythm strip given to EDP. Pt came out of it on own, remained awake. States still feels dizzy but better than when he first got here.

## 2019-01-09 NOTE — ED Notes (Signed)
Called lab to inquire about troponin. Lab stated they will begin to run test at this time.

## 2019-01-09 NOTE — ED Notes (Signed)
Pt ambulatory to toilet with wife. Steady gait noted.

## 2019-01-09 NOTE — ED Notes (Signed)
EDP at bedside  

## 2019-01-09 NOTE — ED Notes (Signed)
Dr. Callwood at bedside. °

## 2019-01-09 NOTE — ED Provider Notes (Signed)
[facility] Emergency Department Provider Note  Time seen: 8:49 AM  I have reviewed the triage vital signs and the nursing notes.   HISTORY  Chief Complaint Dizziness   HPI Jason Doyle is a 73 y.o. male with a past medical history of asthma, diabetes, gout, hypertension, presents to the emergency department for dizziness and nausea.  According to the patient states he felt well last night going to bed he woke up around 3:00 this morning to use the bathroom and felt lightheaded with dizziness.  Patient states he tried to sit up in a recliner for an hour or 2 but became nauseated and had dry heaves.  States the nausea sensation is since passed.  States he is feeling much better denies any dizziness at this time.  Denies any chest pain or abdominal pain at any point.  Denies any recent fever cough or shortness of breath.  Patient has a history of hypertension however is quite hypertensive 209/94 currently.  Denies any weakness or numbness at any point.  Patient does have a history of a stroke approximately 5 months ago, but denies any lasting deficits.   Past Medical History:  Diagnosis Date  . Ankylosing spondylitis ([place])   . Arthritis    knee,hands,back and neck  . Asthma    LAST ATTACK 4-5 YRS AGO  . BPH (benign prostatic hyperplasia)   . CVA (cerebral vascular accident) ([place])    08-20-2018 per patient  . Diabetes mellitus without complication ([place])    TYPE 2  . Gout   . H/O multiple allergies    SEASONAL,ENVIRONMENTAL  . H/O sinus surgery    x4  . Headache    sinus  . Hypertension   . Rotator cuff tear    left shoulder  . Sleep apnea    C-PAP    Patient Active Problem List   Diagnosis Date Noted  . Stroke (cerebrum) ([place]) 08/20/2018    Past Surgical History:  Procedure Laterality Date  . BICEPT TENODESIS Left 07/23/2014   Procedure: BICEPS TENODESIS;  Surgeon: [name], MD;  Location: [facility];  Service: Orthopedics;   Laterality: Left;  . COLONOSCOPY WITH PROPOFOL N/A 04/10/2017   Procedure: COLONOSCOPY WITH PROPOFOL;  Surgeon: [name], MD;  Location: [facility] ENDOSCOPY;  Service: Endoscopy;  Laterality: N/A;  . KNEE ARTHROSCOPY    . LOOP RECORDER INSERTION N/A 11/20/2018   Procedure: LOOP RECORDER INSERTION;  Surgeon: Isaias Cowman, MD;  Location: [place] CV LAB;  Service: Cardiovascular;  Laterality: N/A;  . NASAL SINUS SURGERY     x 4  . SHOULDER ARTHROSCOPY WITH ROTATOR CUFF REPAIR AND SUBACROMIAL DECOMPRESSION Left 07/23/2014   Procedure: SHOULDER ARTHROSCOPY WITH ROTATOR CUFF REPAIR AND SUBACROMIAL DECOMPRESSION;  Surgeon: [name], MD;  Location: [place];  Service: Orthopedics;  Laterality: Left;  . UVULOPALATOPHARYNGOPLASTY      Prior to Admission medications   Medication Sig Start Date End Date Taking? Authorizing Provider  acetaminophen (TYLENOL) 500 MG tablet Take 500-1,000 mg by mouth every 6 (six) hours as needed (pain).     [provider]  albuterol (PROVENTIL HFA;VENTOLIN HFA) 108 (90 BASE) MCG/ACT inhaler Inhale 2 puffs into the lungs as needed for wheezing or shortness of breath.    [provider]  allopurinol (ZYLOPRIM) 100 MG tablet Take 100 mg by mouth daily. am    [provider]  amLODipine (NORVASC) 10 MG tablet Take 5 mg by mouth daily. 07/25/18  [provider]  aspirin 81 MG chewable tablet Chew 1 tablet (81 mg total) by mouth daily. Start on 08/31/2018 no earlier then that please thank you 08/31/18   Bettey Costa, MD  atorvastatin (LIPITOR) 80 MG tablet Take 40 mg by mouth every evening.     [provider]  Azelastine-Fluticasone 137-50 MCG/ACT SUSP Place 1 spray into the nose 2 (two) times daily as needed (congestion).  03/17/15   [provider]  budesonide (PULMICORT) 0.5 MG/2ML nebulizer solution Place 10 mLs into both nostrils 2 (two) times a day. Add 10 ml (one dose) of medication to 240 ml  of saline in sinus rinse bottle. Irrigate sinuses with 120 ml through each nostril twice daily 04/23/18   [provider]  budesonide-formoterol (SYMBICORT) 160-4.5 MCG/ACT inhaler Inhale 2 puffs into the lungs daily. am    [provider]  cholecalciferol (VITAMIN D) 25 MCG (1000 UT) tablet Take 2,000 Units by mouth daily.    [provider]  empagliflozin (JARDIANCE) 10 MG TABS tablet Take 10 mg by mouth daily.    [provider]  ENBREL SURECLICK 50 MG/ML injection Inject 50 mg into the skin every Wednesday. 07/27/18   [provider]  glipiZIDE (GLUCOTROL XL) 2.5 MG 24 hr tablet Take 2.5 mg by mouth daily with breakfast.    [provider]  lisinopril (PRINIVIL,ZESTRIL) 40 MG tablet Take 40 mg by mouth daily.    [provider]  loratadine (CLARITIN) 10 MG tablet Take 10 mg by mouth daily.    [provider]  montelukast (SINGULAIR) 10 MG tablet Take 10 mg by mouth at bedtime.    [provider]  oxybutynin (DITROPAN) 5 MG tablet Take 5 mg by mouth 2 (two) times daily. 07/25/18   [provider]  tamsulosin (FLOMAX) 0.4 MG CAPS capsule Take 0.4 mg by mouth daily after breakfast.    [provider]  tiZANidine (ZANAFLEX) 4 MG tablet Take 4 mg by mouth at bedtime. 07/25/18   [provider]    No Known Allergies  No family history on file.  Social History Social History   Tobacco Use  . Smoking status: Never Smoker  . Smokeless tobacco: Never Used  Substance Use Topics  . Alcohol use: Not Currently    Comment: RARELY "1/2 glass wine a year"  . Drug use: Not Currently    Review of Systems Constitutional: Negative for fever Cardiovascular: Negative for chest pain. Respiratory: Negative for shortness of breath. Gastrointestinal: Negative for abdominal pain.  Positive for nausea/dry heaves which have since resolved.  Negative for diarrhea Genitourinary: Negative for urinary  compaints Musculoskeletal: Negative for musculoskeletal complaints Neurological: Negative for headache All other ROS negative  ____________________________________________   PHYSICAL EXAM:  VITAL SIGNS: ED Triage Vitals  Enc Vitals Group     BP 01/09/19 0815 (!) 209/94     Pulse Rate 01/09/19 0815 75     Resp 01/09/19 0815 18     Temp 01/09/19 0818 98.4 F (36.9 C)     Temp Source 01/09/19 0818 Oral     SpO2 01/09/19 0815 98 %     Weight 01/09/19 0816 243 lb (110.2 kg)     Height 01/09/19 0816 6\' 2"  (1.88 m)     Head Circumference --      Peak Flow --      Pain Score 01/09/19 0816 0     Pain Loc --      Pain Edu? --  Excl. in [place]? --    Constitutional: Alert and oriented. Well appearing and in no distress. Eyes: Normal exam ENT      Head: Normocephalic and atraumatic.      Mouth/Throat: Mucous membranes are moist. Cardiovascular: Normal rate, regular rhythm. Respiratory: Normal respiratory effort without tachypnea nor retractions. Breath sounds are clear  Gastrointestinal: Soft and nontender. No distention.  Musculoskeletal: Nontender with normal range of motion in all extremities.  Neurologic:  Normal speech and language. No gross focal neurologic deficits.  Equal grip strength bilaterally.  No cranial nerve deficits.  No pronator drift.  No lower extremity drift.  5/5 motor in all extremities. Skin:  Skin is warm, dry and intact.  Psychiatric: Mood and affect are normal.   ____________________________________________    EKG  EKG viewed and interpreted by myself shows a normal sinus rhythm at 60 bpm with a slightly widened QRS, normal axis, normal intervals with nonspecific ST changes without ST elevation.  ____________________________________________    RADIOLOGY  CT head is negative  ____________________________________________   INITIAL IMPRESSION / ASSESSMENT AND PLAN / ED COURSE  Pertinent labs & imaging results that were available during my care  of the patient were reviewed by me and considered in my medical decision making (see chart for details).   Patient presents to the emergency department for dizziness and nausea.  Symptoms have since largely resolved.  However given the patient's history we will check labs including cardiac enzymes, obtain a CT scan of the head, IV hydrate dose Zofran and continue to closely monitor.  Patient agreeable to plan of care.  CT head is negative.  Lab work is largely within normal limits besides a slightly elevated troponin at 23.  Patient has had a fairly significant run of ventricular tachycardia in the emergency department.  I spoke with Dr. Ubaldo Glassing who is covering for Dr. Clayborn Bigness.  He will be down to see the patient shortly.  We will admit to the hospital service.  Discussed the repeat troponin with Dr. Clayborn Bigness which raise from 23-32, overall nonconcerning.  He has given me outpatient recommendations for the patient he would like the patient on lisinopril 40 mg daily, hydrochlorothiazide 12.5 mg daily, labetalol 100 mg p.o. twice daily, Eliquis 5 mg p.o. twice daily and he will follow up with the patient on Monday in the office.  I discussed with the patient he is agreeable to this plan of care and feels safe going home.  I discussed very strict return precautions.  Patient agreeable.   SUFIYAN MCGAFFIGAN was evaluated in Emergency Department on 01/09/2019 for the symptoms described in the history of present illness. He was evaluated in the context of the global COVID-19 pandemic, which necessitated consideration that the patient might be at risk for infection with the SARS-CoV-2 virus that causes COVID-19. Institutional protocols and algorithms that pertain to the evaluation of patients at risk for COVID-19 are in a state of rapid change based on information released by regulatory bodies including the CDC and federal and state organizations. These policies and algorithms were followed during the patient's care in  the ED.  ____________________________________________   FINAL CLINICAL IMPRESSION(S) / ED DIAGNOSES  Dizziness Ventricular tachycardia   Harvest Dark, MD 01/09/19 1316

## 2019-01-09 NOTE — ED Notes (Signed)
Recollect of light green top sent to lab.

## 2019-01-09 NOTE — ED Notes (Signed)
Pt assisted to toilet by wife.

## 2019-01-09 NOTE — ED Triage Notes (Signed)
Pt here with c/o dizziness "feeling swimmy headed" that began overnight last night. This am, he became nauseated, denies vertigo. Hx of stroke in July. No deficits noted at this time.

## 2019-01-09 NOTE — ED Notes (Signed)
Pt assisted to the restroom

## 2019-01-09 NOTE — Discharge Instructions (Addendum)
You have been seen in the emergency department for dizziness.  This could possibly be related to your newly diagnosed atrial fibrillation.  Please begin taking your blood thinner as prescribed you have also been prescribed a rate control medication labetalol please take this as prescribed as well.  Please temporarily discontinue your amlodipine 10 mg tablets until you have been seen and evaluated by cardiology.  Please follow-up with cardiology this Monday in the office.  Return to the emergency department for any further episodes of significant dizziness, lightheadedness, if you pass out or develop any chest pain or shortness of breath.

## 2019-03-09 ENCOUNTER — Ambulatory Visit: Payer: Medicare Other | Attending: Internal Medicine

## 2019-03-09 DIAGNOSIS — Z23 Encounter for immunization: Secondary | ICD-10-CM | POA: Insufficient documentation

## 2019-03-09 NOTE — Progress Notes (Signed)
   Covid-19 Vaccination Clinic  Name:  TODRICK OLSHEFSKI    MRN: [id number] DOB: 1945-10-04  03/09/2019  Mr. Tea was observed post Covid-19 immunization for 15 minutes without incidence. He was provided with Vaccine Information Sheet and instruction to access the V-Safe system.   Mr. Berhane was instructed to call 911 with any severe reactions post vaccine: Marland Kitchen Difficulty breathing  . Swelling of your face and throat  . A fast heartbeat  . A bad rash all over your body  . Dizziness and weakness    Immunizations Administered    Name Date Dose VIS Date Route   Pfizer COVID-19 Vaccine 03/09/2019  2:09 PM 0.3 mL 01/25/2019 Intramuscular   Manufacturer: [place]   Lot: [id number]   [place]: [id number]

## 2019-03-30 ENCOUNTER — Ambulatory Visit: Payer: Medicare Other | Attending: Internal Medicine

## 2019-03-30 DIAGNOSIS — Z23 Encounter for immunization: Secondary | ICD-10-CM | POA: Insufficient documentation

## 2019-03-30 NOTE — Progress Notes (Signed)
   Covid-19 Vaccination Clinic  Name:  Jason Doyle    MRN: [id number] DOB: August 30, 1945  03/30/2019  Jason Doyle was observed post Covid-19 immunization for 15 minutes without incidence. He was provided with Vaccine Information Sheet and instruction to access the V-Safe system.   Jason Doyle was instructed to call 911 with any severe reactions post vaccine: Marland Kitchen Difficulty breathing  . Swelling of your face and throat  . A fast heartbeat  . A bad rash all over your body  . Dizziness and weakness    Immunizations Administered    Name Date Dose VIS Date Route   Pfizer COVID-19 Vaccine 03/30/2019 12:31 PM 0.3 mL 01/25/2019 Intramuscular   Manufacturer: [place]   Lot: [id number]   [place]: [id number]

## 2019-04-27 ENCOUNTER — Emergency Department
Admission: EM | Admit: 2019-04-27 | Discharge: 2019-04-27 | Disposition: A | Discharge status: Home / Self Care | Payer: Medicare Other | Attending: Emergency Medicine | Admitting: Emergency Medicine

## 2019-04-27 ENCOUNTER — Other Ambulatory Visit: Payer: Self-pay

## 2019-04-27 DIAGNOSIS — Z79899 Other long term (current) drug therapy: Secondary | ICD-10-CM | POA: Diagnosis not present

## 2019-04-27 DIAGNOSIS — J45909 Unspecified asthma, uncomplicated: Secondary | ICD-10-CM | POA: Diagnosis not present

## 2019-04-27 DIAGNOSIS — Z7901 Long term (current) use of anticoagulants: Secondary | ICD-10-CM | POA: Insufficient documentation

## 2019-04-27 DIAGNOSIS — R55 Syncope and collapse: Secondary | ICD-10-CM | POA: Insufficient documentation

## 2019-04-27 DIAGNOSIS — R42 Dizziness and giddiness: Secondary | ICD-10-CM | POA: Insufficient documentation

## 2019-04-27 DIAGNOSIS — I10 Essential (primary) hypertension: Secondary | ICD-10-CM | POA: Diagnosis not present

## 2019-04-27 DIAGNOSIS — Z7984 Long term (current) use of oral hypoglycemic drugs: Secondary | ICD-10-CM | POA: Diagnosis not present

## 2019-04-27 DIAGNOSIS — E119 Type 2 diabetes mellitus without complications: Secondary | ICD-10-CM | POA: Diagnosis not present

## 2019-04-27 LAB — CBC WITH DIFFERENTIAL/PLATELET
Abs Immature Granulocytes: 0.01 10*3/uL (ref 0.00–0.07)
Basophils Absolute: 0 10*3/uL (ref 0.0–0.1)
Basophils Relative: 1 %
Eosinophils Absolute: 0.1 10*3/uL (ref 0.0–0.5)
Eosinophils Relative: 1 %
HCT: 54.3 % — ABNORMAL HIGH (ref 39.0–52.0)
Hemoglobin: 17.3 g/dL — ABNORMAL HIGH (ref 13.0–17.0)
Immature Granulocytes: 0 %
Lymphocytes Relative: 46 %
Lymphs Abs: 2.7 10*3/uL (ref 0.7–4.0)
MCH: 29.1 pg (ref 26.0–34.0)
MCHC: 31.9 g/dL (ref 30.0–36.0)
MCV: 91.4 fL (ref 80.0–100.0)
Monocytes Absolute: 0.6 10*3/uL (ref 0.1–1.0)
Monocytes Relative: 11 %
Neutro Abs: 2.4 10*3/uL (ref 1.7–7.7)
Neutrophils Relative %: 41 %
Platelets: 146 10*3/uL — ABNORMAL LOW (ref 150–400)
RBC: 5.94 MIL/uL — ABNORMAL HIGH (ref 4.22–5.81)
RDW: 13.9 % (ref 11.5–15.5)
WBC: 5.8 10*3/uL (ref 4.0–10.5)
nRBC: 0 % (ref 0.0–0.2)

## 2019-04-27 LAB — BASIC METABOLIC PANEL
Anion gap: 9 (ref 5–15)
BUN: 15 mg/dL (ref 8–23)
CO2: 26 mmol/L (ref 22–32)
Calcium: 8.5 mg/dL — ABNORMAL LOW (ref 8.9–10.3)
Chloride: 104 mmol/L (ref 98–111)
Creatinine, Ser: 1.17 mg/dL (ref 0.61–1.24)
GFR calc Af Amer: >60 mL/min (ref >60)
GFR calc non Af Amer: >60 mL/min (ref >60)
Glucose, Bld: 130 mg/dL — ABNORMAL HIGH (ref 70–99)
Potassium: 3.7 mmol/L (ref 3.5–5.1)
Sodium: 139 mmol/L (ref 135–145)

## 2019-04-27 LAB — TROPONIN I (HIGH SENSITIVITY)
Troponin I (High Sensitivity): 31 ng/L — ABNORMAL HIGH (ref <18)
Troponin I (High Sensitivity): 33 ng/L — ABNORMAL HIGH (ref <18)

## 2019-04-27 MED ORDER — LACTATED RINGERS IV BOLUS
1000.0000 mL | Freq: Once | INTRAVENOUS | Status: AC
  Administered 2019-04-27: 1000 mL via INTRAVENOUS

## 2019-04-27 NOTE — ED Triage Notes (Signed)
Patient reports woke this morning around 2:30 am and was feeling dizzy, took his bp and it was high.

## 2019-04-27 NOTE — ED Provider Notes (Signed)
[facility] Emergency Department Provider Note   ____________________________________________   First MD Initiated Contact with Patient 04/27/19 [phone]     (approximate)  I have reviewed the triage vital signs and the nursing notes.   HISTORY  Chief Complaint Dizziness    HPI Jason Doyle is a 74 y.o. male with past medical history of stroke, asthma, diabetes, ankylosing spondylitis, and sick sinus syndrome presents to the ED complaining of dizziness.  Patient reports that he woke up at about an hour ago to go to the bathroom, but when he sat up and went to stand, he felt very lightheaded and like he might pass out.  He reports feeling very dizzy at that time but did not have any chest pain or shortness of breath.  He was feeling fine when he went to bed with no recent fevers, cough, vomiting, diarrhea, abdominal pain, dysuria, or hematuria.  He states he has had similar episodes in the past and at one point they were unsure whether he had atrial fibrillation.  He had a loop recorder placed and Eliquis was stopped due to no definitive atrial fibrillation identified.  He has also had issues with bradycardia in the past, but states this has recently been improved after his labetalol was stopped.        Past Medical History:  Diagnosis Date  . Ankylosing spondylitis ([place])   . Arthritis    knee,hands,back and neck  . Asthma    LAST ATTACK 4-5 YRS AGO  . BPH (benign prostatic hyperplasia)   . CVA (cerebral vascular accident) ([place])    08-20-2018 per patient  . Diabetes mellitus without complication ([place])    TYPE 2  . Gout   . H/O multiple allergies    SEASONAL,ENVIRONMENTAL  . H/O sinus surgery    x4  . Headache    sinus  . Hypertension   . Rotator cuff tear    left shoulder  . Sleep apnea    C-PAP    Patient Active Problem List   Diagnosis Date Noted  . Ventricular tachyarrhythmia ([place]) 01/09/2019  . Stroke (cerebrum) ([place]) 08/20/2018     Past Surgical History:  Procedure Laterality Date  . BICEPT TENODESIS Left 07/23/2014   Procedure: BICEPS TENODESIS;  Surgeon: [name], MD;  Location: [place];  Service: Orthopedics;  Laterality: Left;  . COLONOSCOPY WITH PROPOFOL N/A 04/10/2017   Procedure: COLONOSCOPY WITH PROPOFOL;  Surgeon: [name], MD;  Location: [facility] ENDOSCOPY;  Service: Endoscopy;  Laterality: N/A;  . KNEE ARTHROSCOPY    . LOOP RECORDER INSERTION N/A 11/20/2018   Procedure: LOOP RECORDER INSERTION;  Surgeon: Isaias Cowman, MD;  Location: [facility] CV LAB;  Service: Cardiovascular;  Laterality: N/A;  . NASAL SINUS SURGERY     x 4  . SHOULDER ARTHROSCOPY WITH ROTATOR CUFF REPAIR AND SUBACROMIAL DECOMPRESSION Left 07/23/2014   Procedure: SHOULDER ARTHROSCOPY WITH ROTATOR CUFF REPAIR AND SUBACROMIAL DECOMPRESSION;  Surgeon: [name], MD;  Location: [place];  Service: Orthopedics;  Laterality: Left;  . UVULOPALATOPHARYNGOPLASTY      Prior to Admission medications   Medication Sig Start Date End Date Taking? Authorizing Provider  acetaminophen (TYLENOL) 500 MG tablet Take 500-1,000 mg by mouth every 6 (six) hours as needed (pain).     [provider]  albuterol (PROVENTIL HFA;VENTOLIN HFA) 108 (90 BASE) MCG/ACT inhaler Inhale 2 puffs into the lungs as needed for wheezing or shortness of breath.    [provider]  allopurinol (ZYLOPRIM) 100 MG tablet Take 100 mg by mouth daily. am    [provider]  amLODipine (NORVASC) 10 MG tablet Take 10 mg by mouth daily.  07/25/18   [provider]  apixaban (ELIQUIS) 5 MG TABS tablet Take 1 tablet (5 mg total) by mouth 2 (two) times daily. 01/09/19   Harvest Dark, MD  aspirin 81 MG chewable tablet Chew 1 tablet (81 mg total) by mouth daily. Start on 08/31/2018 no earlier then that please thank you 08/31/18   Bettey Costa, MD  atorvastatin (LIPITOR) 80 MG tablet Take 40 mg by mouth every  evening.     [provider]  Azelastine-Fluticasone 137-50 MCG/ACT SUSP Place 1 spray into the nose 2 (two) times daily as needed (congestion).  03/17/15   [provider]  budesonide (PULMICORT) 0.5 MG/2ML nebulizer solution Place 10 mLs into both nostrils 2 (two) times a day. Add 10 ml (one dose) of medication to 240 ml of saline in sinus rinse bottle. Irrigate sinuses with 120 ml through each nostril twice daily 04/23/18   [provider]  budesonide-formoterol (SYMBICORT) 160-4.5 MCG/ACT inhaler Inhale 2 puffs into the lungs 2 (two) times daily.     [provider]  Cholecalciferol (VITAMIN D3) 50 MCG (2000 UT) TABS Take 2,000 Units by mouth daily.     [provider]  empagliflozin (JARDIANCE) 10 MG TABS tablet Take 10 mg by mouth daily.    [provider]  ENBREL SURECLICK 50 MG/ML injection Inject 50 mg into the skin every Wednesday. 07/27/18   [provider]  glipiZIDE (GLUCOTROL XL) 2.5 MG 24 hr tablet Take 2.5 mg by mouth daily with breakfast.    [provider]  labetalol (NORMODYNE) 100 MG tablet Take 1 tablet (100 mg total) by mouth 2 (two) times daily. 01/09/19 04/09/19  Harvest Dark, MD  lisinopril (PRINIVIL,ZESTRIL) 40 MG tablet Take 40 mg by mouth daily.    [provider]  loratadine (CLARITIN) 10 MG tablet Take 10 mg by mouth daily.    [provider]  montelukast (SINGULAIR) 10 MG tablet Take 10 mg by mouth at bedtime.    [provider]  oxybutynin (DITROPAN) 5 MG tablet Take 5 mg by mouth 2 (two) times daily. 07/25/18   [provider]  tamsulosin (FLOMAX) 0.4 MG CAPS capsule Take 0.4 mg by mouth daily after breakfast.    [provider]  tiZANidine (ZANAFLEX) 4 MG tablet Take 4 mg by mouth at bedtime. 07/25/18   [provider]    Allergies Metformin  No family history on file.  Social History Social History   Tobacco Use  . Smoking status:  Never Smoker  . Smokeless tobacco: Never Used  Substance Use Topics  . Alcohol use: Not Currently    Comment: RARELY "1/2 glass wine a year"  . Drug use: Not Currently    Review of Systems  Constitutional: No fever/chills Eyes: No visual changes. ENT: No sore throat. Cardiovascular: Denies chest pain.  Positive for lightheadedness and near syncope. Respiratory: Denies shortness of breath. Gastrointestinal: No abdominal pain.  No nausea, no vomiting.  No diarrhea.  No constipation. Genitourinary: Negative for dysuria. Musculoskeletal: Negative for back pain. Skin: Negative for rash. Neurological: Negative for headaches, focal weakness or numbness.  ____________________________________________   PHYSICAL EXAM:  VITAL SIGNS: ED Triage Vitals  Enc Vitals Group     BP 04/27/19 0410 (!) 160/69     Pulse Rate 04/27/19  0410 62     Resp 04/27/19 0410 18     Temp 04/27/19 0410 98.3 F (36.8 C)     Temp Source 04/27/19 0410 Oral     SpO2 04/27/19 0410 97 %     Weight 04/27/19 0408 247 lb (112 kg)     Height 04/27/19 0408 6\' 2"  (1.88 m)     Head Circumference --      Peak Flow --      Pain Score 04/27/19 0408 0     Pain Loc --      Pain Edu? --      Excl. in [place]? --     Constitutional: Alert and oriented. Eyes: Conjunctivae are normal. Head: Atraumatic. Nose: No congestion/rhinnorhea. Mouth/Throat: Mucous membranes are moist. Neck: Normal ROM Cardiovascular: Normal rate, regular rhythm. Grossly normal heart sounds. Respiratory: Normal respiratory effort.  No retractions. Lungs CTAB. Gastrointestinal: Soft and nontender. No distention. Genitourinary: deferred Musculoskeletal: No lower extremity tenderness nor edema. Neurologic:  Normal speech and language. No gross focal neurologic deficits are appreciated. Skin:  Skin is warm, dry and intact. No rash noted. Psychiatric: Mood and affect are normal. Speech and behavior are  normal.  ____________________________________________   LABS (all labs ordered are listed, but only abnormal results are displayed)  Labs Reviewed  BASIC METABOLIC PANEL - Abnormal; Notable for the following components:      Result Value   Glucose, Bld 130 (*)    Calcium 8.5 (*)    All other components within normal limits  CBC WITH DIFFERENTIAL/PLATELET - Abnormal; Notable for the following components:   RBC 5.94 (*)    Hemoglobin 17.3 (*)    HCT 54.3 (*)    Platelets 146 (*)    All other components within normal limits  TROPONIN I (HIGH SENSITIVITY) - Abnormal; Notable for the following components:   Troponin I (High Sensitivity) 31 (*)    All other components within normal limits  TROPONIN I (HIGH SENSITIVITY)   ____________________________________________  EKG  ED ECG REPORT I, Blake Divine, the attending physician, personally viewed and interpreted this ECG.   Date: 04/27/2019  EKG Time: 4:34  Rate: 57  Rhythm: normal sinus rhythm  Axis: Normal  Intervals:none  ST&T Change: Nonspecific T wave changes   PROCEDURES  Procedure(s) performed (including Critical Care):  Procedures   ____________________________________________   INITIAL IMPRESSION / ASSESSMENT AND PLAN / ED COURSE       74 year old male with history of asthma, diabetes, stroke, sick sinus syndrome, and ankylosing spondylitis presents to the ED for episode of lightheadedness and dizziness when attempting to stand up to go to the bathroom.  Patient now states he is feeling better with near resolution of symptoms, and he did not have any chest pain or shortness of breath earlier.  EKG shows normal sinus rhythm with no acute ischemic changes.  Differential includes arrhythmia, vasovagal episode, orthostatic hypotension, electrolyte abnormality.  We will check labs and observe on cardiac monitor, also plan to hydrate with IV fluids.  No apparent infectious process.  Initial labs are unremarkable,  troponin mildly elevated but similar to patient's prior.  He has had no events on cardiac monitor and if repeat troponin remained stable, he will be appropriate for discharge home with cardiology follow-up.  Patient turned over to oncoming provider pending second set troponin.      ____________________________________________   FINAL CLINICAL IMPRESSION(S) / ED DIAGNOSES  Final diagnoses:  Lightheadedness  Near syncope     ED Discharge  Orders    None       Note:  This document was prepared using Dragon voice recognition software and may include unintentional dictation errors.   Blake Divine, MD 04/27/19 [phone]

## 2019-04-27 NOTE — ED Notes (Signed)
Called and spoke with [employer] representative. Requested that we get a print out of the transmission pt submitted this morning. [employer] rep states that the department that handles reports submitted via pt is closed on the weekends and we are not able to get the report. Dr. Jari Pigg made aware.

## 2019-04-27 NOTE — ED Provider Notes (Signed)
7:09 AM Assumed care for off going team.   Blood pressure (!) 172/94, pulse 76, temperature 98.3 F (36.8 C), temperature source Oral, resp. rate 20, height 6\' 2"  (1.88 m), weight 112 kg, SpO2 96 %.  See their HPI for full report but in brief   Dizzy with standing up. Prior admission though afib vs vtach. Has loop recorder, if repeat trop negative plan to discharge patient.  We discussed with the [employer] facility and they stated that they had no way of giving [place] a report of an interrogation even no patient had already fax it over through his at home device.  They stated they had nobody on call to do this on the weekend.  Troponins were stable.  I updated patient.  Patient remains asymptomatic has been the cardiac monitor due to concern for near syncope without any events.  Patient is upset that we are not able to see what the interrogation showed.  I discussed with [facility] clinic Dr. Nehemiah Massed who stated we could try to interrogated ourselves since able to get a report that way.  Otherwise if he is stable they can follow-up with him in office on Monday.  Interrogation was done and it sounds like patient cleared his device around 4 AM.  He stated that prior to that there was only episodes of sinus bradycardia.  Patient came in a little bit after 4 AM so I suspect that this event was already captured then and there was no significant arrhythmia but is a little bit unclear of exact timing of this.  Attempted to call them back to clarify. Finally able to get ahold of them and they said there were NO events this morning correlating with the timing of his dizziness. D/w pt and he is comfortable with dc home and f/u with cards Monday.    PROCEDURES  Procedure(s) performed (including Critical Care):  .1-3 Lead EKG Interpretation Performed by: Vanessa [place], MD Authorized by: Vanessa [place], MD     Interpretation: normal     ECG rate:  70s   ECG rate assessment: normal     Rhythm: sinus rhythm      Ectopy: none     Conduction: normal       ____________________________________________      Vanessa Kimbol[place], MD 04/27/19 1050

## 2019-04-27 NOTE — ED Notes (Signed)
Patient monitor interrogated by this RN

## 2019-04-27 NOTE — ED Notes (Signed)
Pt's wife spoke up Dr. Charna Archer

## 2019-04-27 NOTE — ED Notes (Signed)
Attempted to call [employer] to get report from this morning. They are currently close. Will attempt to call back.  [phone]

## 2019-04-27 NOTE — ED Notes (Signed)
Pt given water 

## 2019-04-27 NOTE — Discharge Instructions (Addendum)
Cardiac markers were stable. No arrhythmia on your loop monitor.  Call [facility] Monday for follow up Return to ER for any other concerns.

## 2019-04-27 NOTE — ED Notes (Signed)
[employer] rep called. They are going to send report of interrogation to [place].

## 2019-07-02 ENCOUNTER — Other Ambulatory Visit: Payer: Self-pay

## 2019-07-02 ENCOUNTER — Other Ambulatory Visit: Payer: Self-pay | Admitting: Physician Assistant

## 2019-07-02 ENCOUNTER — Encounter (INDEPENDENT_AMBULATORY_CARE_PROVIDER_SITE_OTHER): Payer: Self-pay

## 2019-07-02 ENCOUNTER — Ambulatory Visit
Admission: RE | Admit: 2019-07-02 | Discharge: 2019-07-02 | Disposition: A | Discharge status: Home / Self Care | Payer: Medicare Other | Source: Ambulatory Visit | Attending: Physician Assistant | Admitting: Physician Assistant

## 2019-07-02 DIAGNOSIS — M7989 Other specified soft tissue disorders: Secondary | ICD-10-CM

## 2019-10-28 DIAGNOSIS — R569 Unspecified convulsions: Secondary | ICD-10-CM | POA: Insufficient documentation

## 2019-11-25 IMAGING — US BILATERAL CAROTID DUPLEX ULTRASOUND
1 series · 13 of 24 positions shown · non-contrast
Comparison: None.

CLINICAL DATA: Stroke. History of hypertension, hyperlipidemia and
diabetes.

EXAM:
BILATERAL CAROTID DUPLEX ULTRASOUND
TECHNIQUE: Gray scale imaging, color Doppler and duplex ultrasound were
performed of bilateral carotid and vertebral arteries in the neck.

[Series 1: bilateral carotid duplex ultrasound · 0.05mm/px · 13 of 66 slices shown]
[im 1/66]
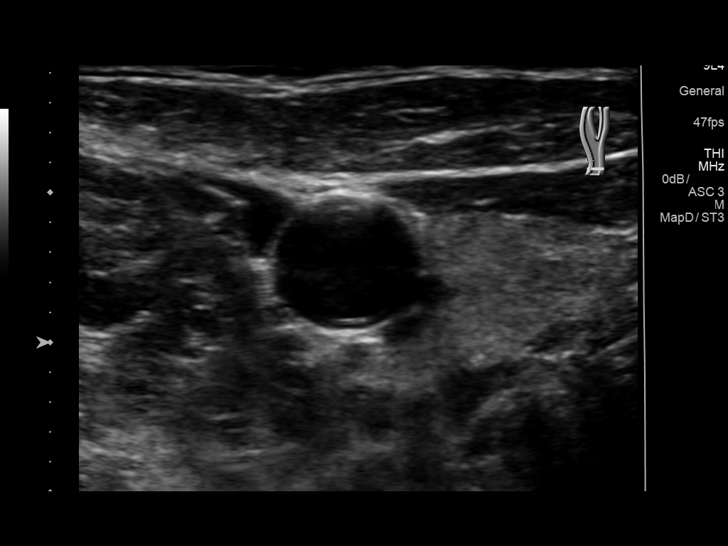
[im 6/66]
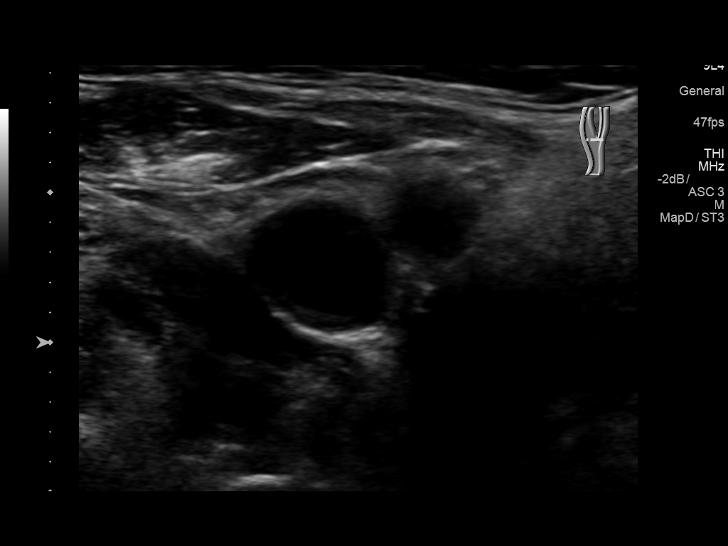
[im 12/66]
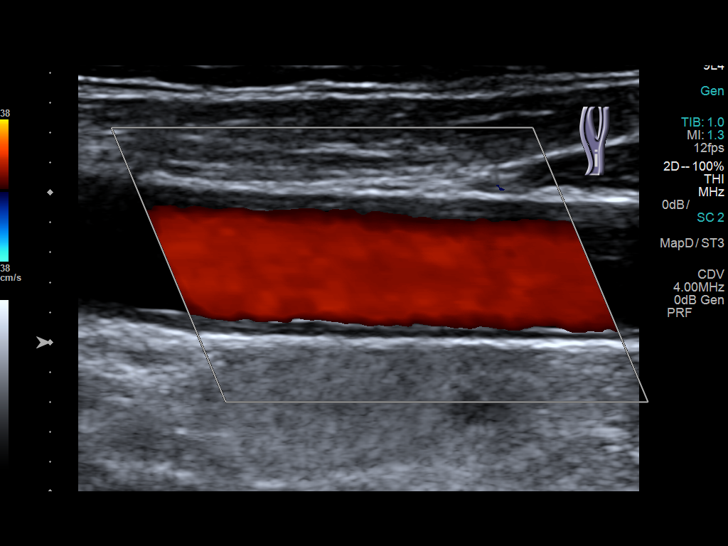
[im 17/66]
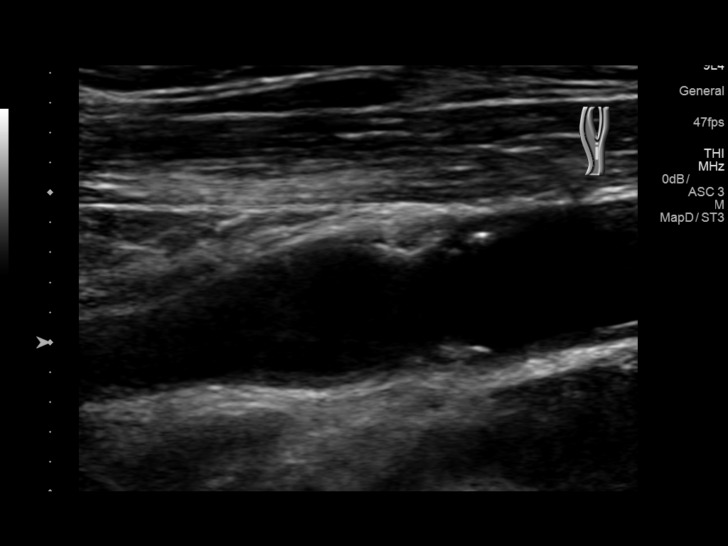
[im 23/66]
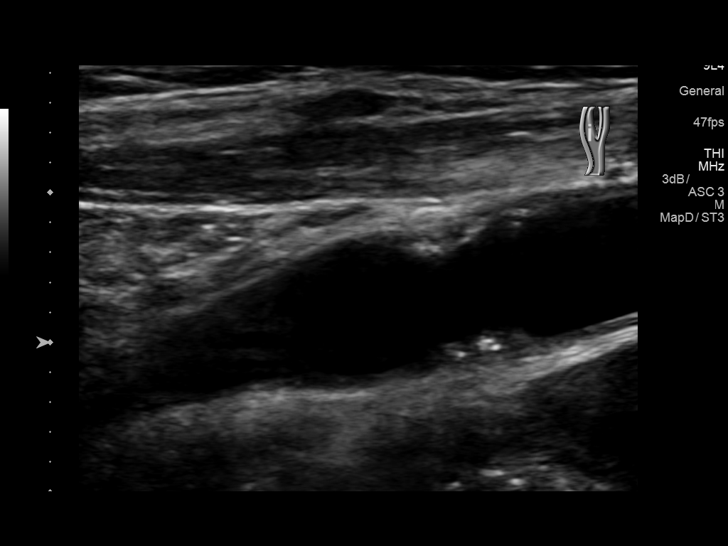
[im 29/66]
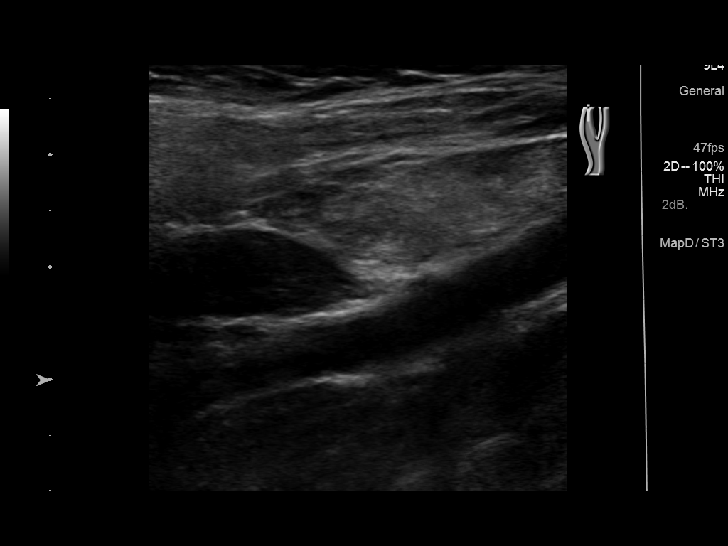
[im 34/66]
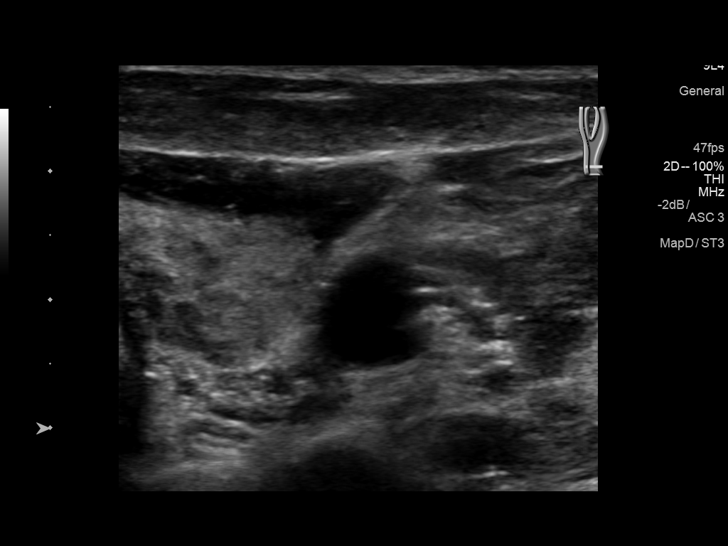
[im 37/66]
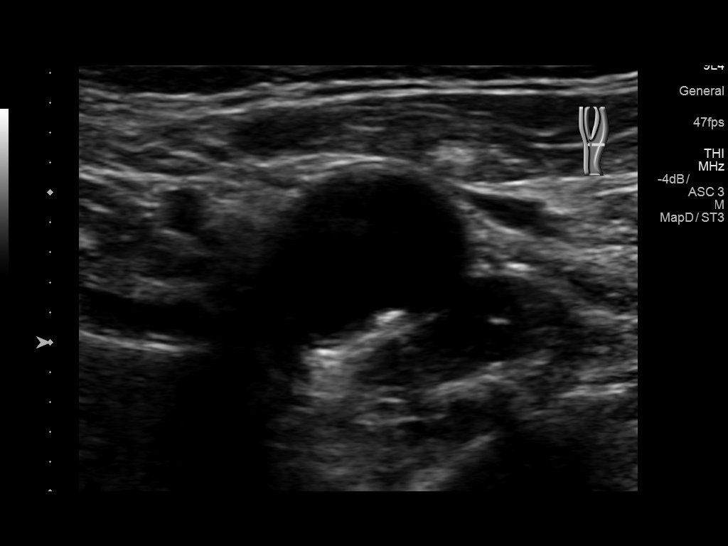
[im 43/66]
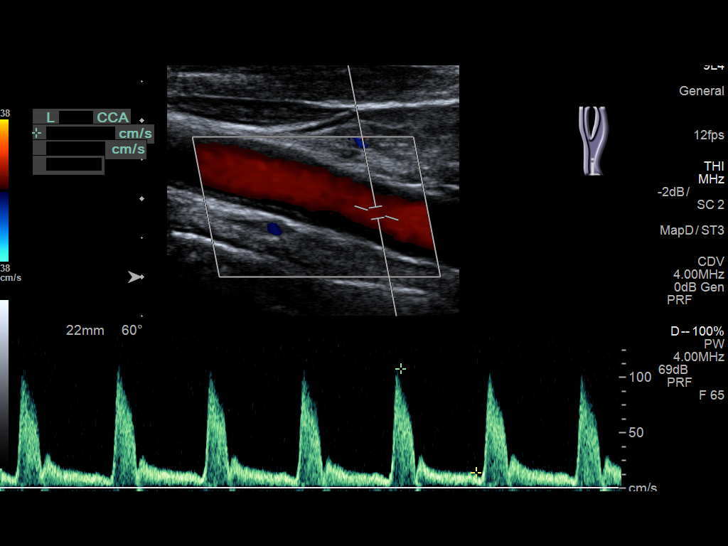
[im 49/66]
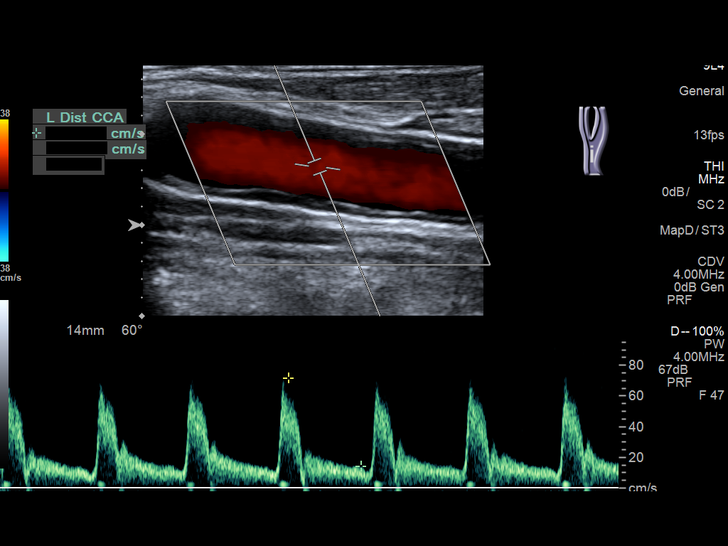
[im 54/66]
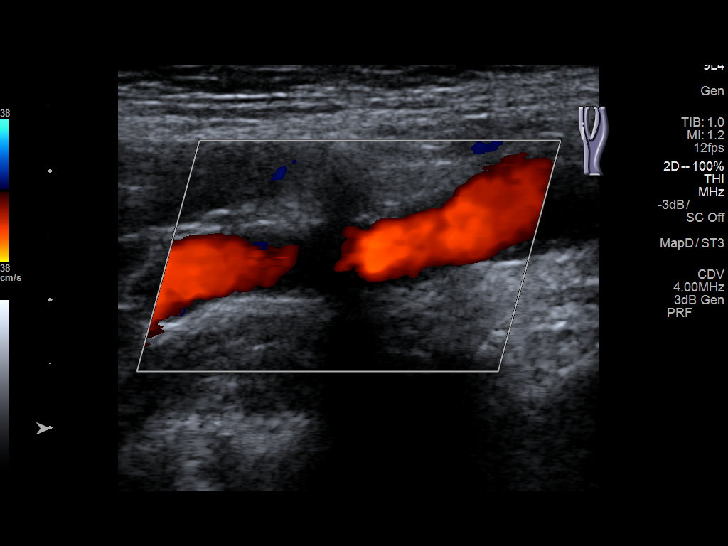
[im 60/66]
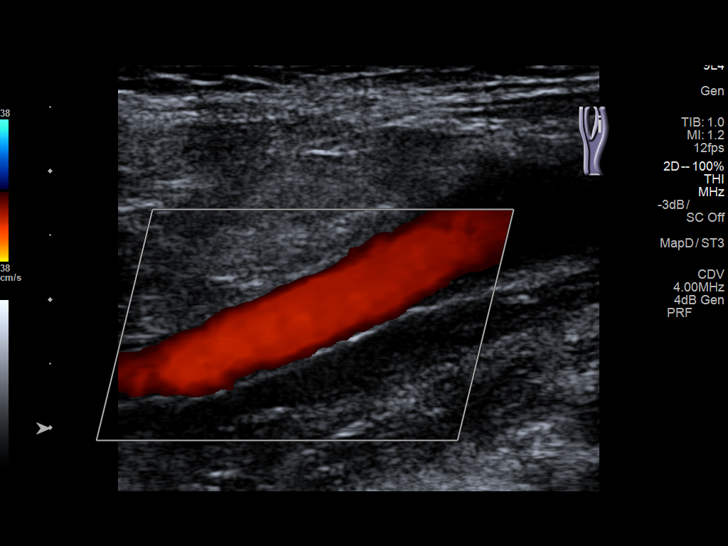
[im 66/66]
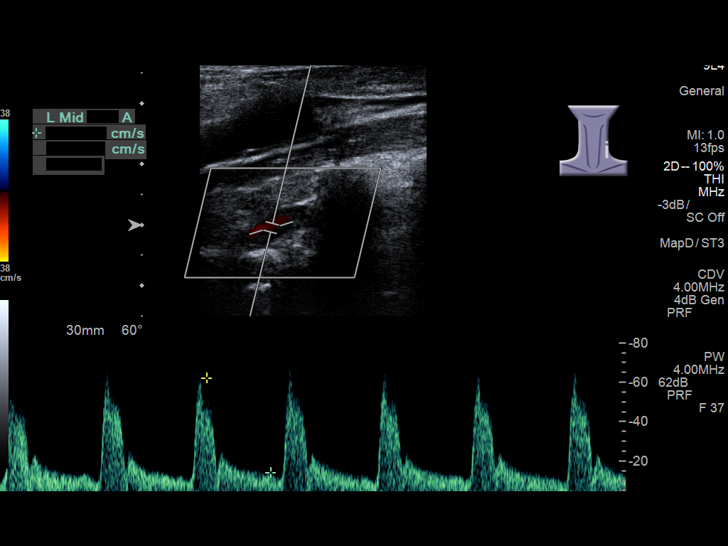

[13 of 24 positions shown; findings below may reference images not displayed]

FINDINGS: Criteria: Quantification of carotid stenosis is based on velocity
parameters that correlate the residual internal carotid diameter
with NASCET-based stenosis levels, using the diameter of the distal
internal carotid lumen as the denominator for stenosis measurement.

The following velocity measurements were obtained:

RIGHT

ICA: 90/29 cm/sec

CCA: 62/12 cm/sec

SYSTOLIC ICA/CCA RATIO:

ECA: 99 cm/sec

LEFT

ICA: 90/24 cm/sec

CCA: 90/16 cm/sec

SYSTOLIC ICA/CCA RATIO:

ECA: 141 cm/sec

RIGHT CAROTID ARTERY: There is a minimal amount of eccentric mixed
echogenic plaque within the right carotid bulb (image 5 and 18),
extending to involve the origin and proximal aspects of the right
internal carotid artery (image 24), not resulting in elevated peak
systolic velocities within the interrogated course of the right
internal carotid artery to suggest a hemodynamically significant
stenosis.

RIGHT VERTEBRAL ARTERY:  Antegrade flow

LEFT CAROTID ARTERY: There is a minimal amount of eccentric
echogenic plaque within the left carotid bulb (image 38 and 51), not
resulting in elevated peak systolic velocities within the
interrogated course of the left internal carotid artery to suggest a
hemodynamically significant stenosis.

LEFT VERTEBRAL ARTERY:  Antegrade flow
IMPRESSION: Minimal amount of bilateral atherosclerotic plaque, right
subjectively greater than left, not resulting in a hemodynamically
significant stenosis within either internal carotid artery.

## 2019-12-10 ENCOUNTER — Emergency Department: Payer: Medicare Other

## 2019-12-10 ENCOUNTER — Other Ambulatory Visit: Payer: Self-pay

## 2019-12-10 DIAGNOSIS — Z7901 Long term (current) use of anticoagulants: Secondary | ICD-10-CM | POA: Diagnosis not present

## 2019-12-10 DIAGNOSIS — Z7982 Long term (current) use of aspirin: Secondary | ICD-10-CM | POA: Diagnosis not present

## 2019-12-10 DIAGNOSIS — H8110 Benign paroxysmal vertigo, unspecified ear: Secondary | ICD-10-CM | POA: Diagnosis not present

## 2019-12-10 DIAGNOSIS — I10 Essential (primary) hypertension: Secondary | ICD-10-CM | POA: Insufficient documentation

## 2019-12-10 DIAGNOSIS — E119 Type 2 diabetes mellitus without complications: Secondary | ICD-10-CM | POA: Insufficient documentation

## 2019-12-10 DIAGNOSIS — Z79899 Other long term (current) drug therapy: Secondary | ICD-10-CM | POA: Insufficient documentation

## 2019-12-10 DIAGNOSIS — R42 Dizziness and giddiness: Secondary | ICD-10-CM | POA: Diagnosis present

## 2019-12-10 DIAGNOSIS — Z7984 Long term (current) use of oral hypoglycemic drugs: Secondary | ICD-10-CM | POA: Diagnosis not present

## 2019-12-10 DIAGNOSIS — J45909 Unspecified asthma, uncomplicated: Secondary | ICD-10-CM | POA: Insufficient documentation

## 2019-12-10 LAB — COMPREHENSIVE METABOLIC PANEL
ALT: 49 U/L — ABNORMAL HIGH (ref 0–44)
AST: 59 U/L — ABNORMAL HIGH (ref 15–41)
Albumin: 4.1 g/dL (ref 3.5–5.0)
Alkaline Phosphatase: 149 U/L — ABNORMAL HIGH (ref 38–126)
Anion gap: 11 (ref 5–15)
BUN: 16 mg/dL (ref 8–23)
CO2: 28 mmol/L (ref 22–32)
Calcium: 9.1 mg/dL (ref 8.9–10.3)
Chloride: 103 mmol/L (ref 98–111)
Creatinine, Ser: 1.06 mg/dL (ref 0.61–1.24)
GFR, Estimated: >60 mL/min (ref >60)
Glucose, Bld: 176 mg/dL — ABNORMAL HIGH (ref 70–99)
Potassium: 4 mmol/L (ref 3.5–5.1)
Sodium: 142 mmol/L (ref 135–145)
Total Bilirubin: 0.9 mg/dL (ref 0.3–1.2)
Total Protein: 7.5 g/dL (ref 6.5–8.1)

## 2019-12-10 LAB — CBC
HCT: 54.3 % — ABNORMAL HIGH (ref 39.0–52.0)
Hemoglobin: 17.5 g/dL — ABNORMAL HIGH (ref 13.0–17.0)
MCH: 29.6 pg (ref 26.0–34.0)
MCHC: 32.2 g/dL (ref 30.0–36.0)
MCV: 91.7 fL (ref 80.0–100.0)
Platelets: 132 10*3/uL — ABNORMAL LOW (ref 150–400)
RBC: 5.92 MIL/uL — ABNORMAL HIGH (ref 4.22–5.81)
RDW: 14 % (ref 11.5–15.5)
WBC: 6.4 10*3/uL (ref 4.0–10.5)
nRBC: 0 % (ref 0.0–0.2)

## 2019-12-10 LAB — TROPONIN I (HIGH SENSITIVITY)
Troponin I (High Sensitivity): 30 ng/L — ABNORMAL HIGH (ref <18)
Troponin I (High Sensitivity): 39 ng/L — ABNORMAL HIGH (ref <18)

## 2019-12-10 NOTE — ED Triage Notes (Signed)
Pt in with acute onset of dizziness and nausea since 1530. States has vomited a few times since. Hx of the same which has had multiple tests for. Has seen neuro and cardiology without any results. Pt has hx of CVA last year, does have episodes of hypotension and was taken off of a bp med.

## 2019-12-11 ENCOUNTER — Emergency Department: Payer: Medicare Other

## 2019-12-11 ENCOUNTER — Emergency Department
Admission: EM | Admit: 2019-12-11 | Discharge: 2019-12-11 | Disposition: A | Discharge status: Home / Self Care | Payer: Medicare Other | Attending: Emergency Medicine | Admitting: Emergency Medicine

## 2019-12-11 DIAGNOSIS — H8110 Benign paroxysmal vertigo, unspecified ear: Secondary | ICD-10-CM

## 2019-12-11 DIAGNOSIS — R42 Dizziness and giddiness: Secondary | ICD-10-CM

## 2019-12-11 LAB — CBG MONITORING, ED: Glucose-Capillary: 190 mg/dL — ABNORMAL HIGH (ref 70–99)

## 2019-12-11 MED ORDER — ONDANSETRON HCL 4 MG/2ML IJ SOLN
4.0000 mg | Freq: Once | INTRAMUSCULAR | Status: AC
  Administered 2019-12-11: 4 mg via INTRAVENOUS
  Filled 2019-12-11: qty 2

## 2019-12-11 MED ORDER — OXYMETAZOLINE HCL 0.05 % NA SOLN: 2.0000 | Freq: Two times a day (BID) | NASAL | 2 refills | Status: AC | PRN

## 2019-12-11 MED ORDER — LACTATED RINGERS IV BOLUS
1000.0000 mL | Freq: Once | INTRAVENOUS | Status: AC
  Administered 2019-12-11: 1000 mL via INTRAVENOUS

## 2019-12-11 MED ORDER — MECLIZINE HCL 25 MG PO TABS
25.0000 mg | ORAL_TABLET | Freq: Once | ORAL | Status: AC
  Administered 2019-12-11: 25 mg via ORAL
  Filled 2019-12-11: qty 1

## 2019-12-11 MED ORDER — MECLIZINE HCL 25 MG PO TABS: 25.0000 mg | ORAL_TABLET | Freq: Three times a day (TID) | ORAL | 0 refills | Status: AC | PRN

## 2019-12-11 MED ORDER — ACETAMINOPHEN 500 MG PO TABS
1000.0000 mg | ORAL_TABLET | Freq: Once | ORAL | Status: AC
  Administered 2019-12-11: 1000 mg via ORAL
  Filled 2019-12-11: qty 2

## 2019-12-11 MED ORDER — OXYMETAZOLINE HCL 0.05 % NA SOLN
1.0000 | Freq: Once | NASAL | Status: AC
  Administered 2019-12-11: 1 via NASAL
  Filled 2019-12-11: qty 30

## 2019-12-11 NOTE — Discharge Instructions (Signed)
We have sent prescriptions for meclizine/Antivert and Afrin nasal spray to the pharmacy for you.  You may use the meclizine as needed up to 3 times daily for your dizziness, and it is safe to drive and work with this medication.  If you develop any significantly worsening symptoms, particularly dizziness does not go away while seated or with these medications, dizziness in combination with passing out or fevers, please return to the ED.  Otherwise, please follow-up with your ENT doctor to discuss your continued symptoms as it is likely due to your sinus congestion.

## 2019-12-11 NOTE — ED Provider Notes (Signed)
[facility] Emergency Department Provider Note  ____________________________________________  Time seen: Approximately 5:02 AM  I have reviewed the triage vital signs and the nursing notes.   HISTORY  Chief Complaint Dizziness   HPI Jason Doyle is a 74 y.o. male with a history of CVA in 2020, diabetes, recurrent sinus infections, hypertension, OSA on CPAP, ankylosing spondylitis, sick sinus syndrome who presents for evaluation of dizziness.  Patient reports that he was sitting at home earlier today when he developed dizziness.  He describes the dizziness as feeling off balance and feeling like he is going to fall.  He has had symptoms all day today which have been constant.  Initially they were pretty severe and associated with nausea and vomiting. at this time the symptoms are mild but still present.  It is not better when he is not moving or worse with movement.  He does report recurrent sinus infections and recently finished antibiotics for it last week.  He is followed with Dr. Pryor Ochoa and had a visit this week with him.  He was started on a new intranasal steroid for his sinus infection.  He had some cultures done from his sinuses which are pending.  Patient does report a frontal pressure-like headache that he usually has with sinus infections.  Denies any changes in vision, slurred speech, diplopia, dysarthria, dysphagia, difficulty finding words, unilateral weakness or numbness.  This is patient's third episode of dizziness since his stroke in 2020.  He reports that this 1 is more severe than the others.  No chest pain or shortness of breath.   Past Medical History:  Diagnosis Date  . Ankylosing spondylitis ([place])   . Arthritis    knee,hands,back and neck  . Asthma    LAST ATTACK 4-5 YRS AGO  . BPH (benign prostatic hyperplasia)   . CVA (cerebral vascular accident) ([place])    08-20-2018 per patient  . Diabetes mellitus without complication ([place])    TYPE 2   . Gout   . H/O multiple allergies    SEASONAL,ENVIRONMENTAL  . H/O sinus surgery    x4  . Headache    sinus  . Hypertension   . Rotator cuff tear    left shoulder  . Sleep apnea    C-PAP    Patient Active Problem List   Diagnosis Date Noted  . Ventricular tachyarrhythmia ([place]) 01/09/2019  . Stroke (cerebrum) ([place]) 08/20/2018    Past Surgical History:  Procedure Laterality Date  . BICEPT TENODESIS Left 07/23/2014   Procedure: BICEPS TENODESIS;  Surgeon: [name], MD;  Location: [facility];  Service: Orthopedics;  Laterality: Left;  . COLONOSCOPY WITH PROPOFOL N/A 04/10/2017   Procedure: COLONOSCOPY WITH PROPOFOL;  Surgeon: [name], MD;  Location: [facility] ENDOSCOPY;  Service: Endoscopy;  Laterality: N/A;  . KNEE ARTHROSCOPY    . LOOP RECORDER INSERTION N/A 11/20/2018   Procedure: LOOP RECORDER INSERTION;  Surgeon: Isaias Cowman, MD;  Location: [facility] CV LAB;  Service: Cardiovascular;  Laterality: N/A;  . NASAL SINUS SURGERY     x 4  . SHOULDER ARTHROSCOPY WITH ROTATOR CUFF REPAIR AND SUBACROMIAL DECOMPRESSION Left 07/23/2014   Procedure: SHOULDER ARTHROSCOPY WITH ROTATOR CUFF REPAIR AND SUBACROMIAL DECOMPRESSION;  Surgeon: [name], MD;  Location: [facility];  Service: Orthopedics;  Laterality: Left;  . UVULOPALATOPHARYNGOPLASTY      Prior to Admission medications   Medication Sig Start Date End Date Taking? Authorizing Provider  acetaminophen (TYLENOL) 500 MG  tablet Take 500-1,000 mg by mouth every 6 (six) hours as needed (pain).     [provider]  albuterol (PROVENTIL HFA;VENTOLIN HFA) 108 (90 BASE) MCG/ACT inhaler Inhale 2 puffs into the lungs as needed for wheezing or shortness of breath.    [provider]  allopurinol (ZYLOPRIM) 100 MG tablet Take 100 mg by mouth daily. am    [provider]  amLODipine (NORVASC) 10 MG tablet Take 10 mg by mouth daily.  07/25/18   [provider]  apixaban  (ELIQUIS) 5 MG TABS tablet Take 1 tablet (5 mg total) by mouth 2 (two) times daily. 01/09/19   Harvest Dark, MD  aspirin 81 MG chewable tablet Chew 1 tablet (81 mg total) by mouth daily. Start on 08/31/2018 no earlier then that please thank you 08/31/18   Bettey Costa, MD  atorvastatin (LIPITOR) 80 MG tablet Take 40 mg by mouth every evening.     [provider]  Azelastine-Fluticasone 137-50 MCG/ACT SUSP Place 1 spray into the nose 2 (two) times daily as needed (congestion).  03/17/15   [provider]  budesonide (PULMICORT) 0.5 MG/2ML nebulizer solution Place 10 mLs into both nostrils 2 (two) times a day. Add 10 ml (one dose) of medication to 240 ml of saline in sinus rinse bottle. Irrigate sinuses with 120 ml through each nostril twice daily 04/23/18   [provider]  budesonide-formoterol (SYMBICORT) 160-4.5 MCG/ACT inhaler Inhale 2 puffs into the lungs 2 (two) times daily.     [provider]  Cholecalciferol (VITAMIN D3) 50 MCG (2000 UT) TABS Take 2,000 Units by mouth daily.     [provider]  empagliflozin (JARDIANCE) 10 MG TABS tablet Take 10 mg by mouth daily.    [provider]  ENBREL SURECLICK 50 MG/ML injection Inject 50 mg into the skin every Wednesday. 07/27/18   [provider]  glipiZIDE (GLUCOTROL XL) 2.5 MG 24 hr tablet Take 2.5 mg by mouth daily with breakfast.    [provider]  labetalol (NORMODYNE) 100 MG tablet Take 1 tablet (100 mg total) by mouth 2 (two) times daily. 01/09/19 04/09/19  Harvest Dark, MD  lisinopril (PRINIVIL,ZESTRIL) 40 MG tablet Take 40 mg by mouth daily.    [provider]  loratadine (CLARITIN) 10 MG tablet Take 10 mg by mouth daily.    [provider]  meclizine (ANTIVERT) 25 MG tablet Take 1 tablet (25 mg total) by mouth 3 (three) times daily as needed for dizziness. 12/11/19   Vladimir Crofts, MD  montelukast (SINGULAIR) 10 MG tablet Take 10 mg by mouth at  bedtime.    [provider]  oxybutynin (DITROPAN) 5 MG tablet Take 5 mg by mouth 2 (two) times daily. 07/25/18   [provider]  oxymetazoline (AFRIN) 0.05 % nasal spray Place 2 sprays into both nostrils 2 (two) times daily as needed (nasal congestion). 12/11/19 12/10/20  Vladimir Crofts, MD  tamsulosin (FLOMAX) 0.4 MG CAPS capsule Take 0.4 mg by mouth daily after breakfast.    [provider]  tiZANidine (ZANAFLEX) 4 MG tablet Take 4 mg by mouth at bedtime. 07/25/18   [provider]    Allergies Metformin  No family history on file.  Social History Social History   Tobacco Use  . Smoking status: Never Smoker  . Smokeless tobacco: Never Used  Vaping Use  . Vaping Use: Never used  Substance Use Topics  . Alcohol use: Not Currently    Comment: RARELY "1/2  glass wine a year"  . Drug use: Not Currently    Review of Systems  Constitutional: Negative for fever. Eyes: Negative for visual changes. ENT: Negative for sore throat. + sinus congestion Neck: No neck pain  Cardiovascular: Negative for chest pain. Respiratory: Negative for shortness of breath. Gastrointestinal: Negative for abdominal pain, vomiting or diarrhea. Genitourinary: Negative for dysuria. Musculoskeletal: Negative for back pain. Skin: Negative for rash. Neurological: Negative for weakness or numbness. + dizziness and HA Psych: No SI or HI  ____________________________________________   PHYSICAL EXAM:  VITAL SIGNS: ED Triage Vitals  Enc Vitals Group     BP 12/10/19 1923 (!) 186/83     Pulse Rate 12/10/19 1923 67     Resp 12/10/19 1923 18     Temp 12/10/19 1923 98.5 F (36.9 C)     Temp Source 12/10/19 1923 Oral     SpO2 12/10/19 1923 98 %     Weight 12/10/19 1923 241 lb (109.3 kg)     Height 12/10/19 1923 6\' 2"  (1.88 m)     Head Circumference --      Peak Flow --      Pain Score 12/10/19 1931 0     Pain Loc --      Pain Edu? --      Excl. in [place]? --      Constitutional: Alert and oriented. Well appearing and in no apparent distress. HEENT:      Head: Normocephalic and atraumatic.         Eyes: Conjunctivae are normal. Sclera is non-icteric. EOMI, PEERL      Ears: TMs visualized and clear      Mouth/Throat: Mucous membranes are moist.       Neck: Supple with no signs of meningismus. Cardiovascular: Regular rate and rhythm. No murmurs, gallops, or rubs. 2+ symmetrical distal pulses are present in all extremities. No JVD. Respiratory: Normal respiratory effort. Lungs are clear to auscultation bilaterally. No wheezes, crackles, or rhonchi.  Gastrointestinal: Soft, non tender. Musculoskeletal: Trace pitting edema bilateral lower extremity Neurologic: Normal speech and language. Face is symmetric.  Intact strength and sensation x4, no pronator drift, no dysmetria, normal gait Skin: Skin is warm, dry and intact. No rash noted. Psychiatric: Mood and affect are normal. Speech and behavior are normal.  ____________________________________________   LABS (all labs ordered are listed, but only abnormal results are displayed)  Labs Reviewed  CBC - Abnormal; Notable for the following components:      Result Value   RBC 5.92 (*)    Hemoglobin 17.5 (*)    HCT 54.3 (*)    Platelets 132 (*)    All other components within normal limits  COMPREHENSIVE METABOLIC PANEL - Abnormal; Notable for the following components:   Glucose, Bld 176 (*)    AST 59 (*)    ALT 49 (*)    Alkaline Phosphatase 149 (*)    All other components within normal limits  CBG MONITORING, ED - Abnormal; Notable for the following components:   Glucose-Capillary 190 (*)    All other components within normal limits  TROPONIN I (HIGH SENSITIVITY) - Abnormal; Notable for the following components:   Troponin I (High Sensitivity) 30 (*)    All other components within normal limits  TROPONIN I (HIGH SENSITIVITY) - Abnormal; Notable for the following components:   Troponin I  (High Sensitivity) 39 (*)    All other components within normal limits   ____________________________________________  EKG  ED ECG REPORT I,  Rudene Re, the attending physician, personally viewed and interpreted this ECG.   Sinus rhythm, rate of 68, normal QTC, nonspecific intraventricular conduction delay, no ST elevations or depressions.  No significant changes when compared to prior. ____________________________________________  RADIOLOGY  I have personally reviewed the images performed during this visit and I agree with the Radiologist's read.   Interpretation by Radiologist:  MR BRAIN WO CONTRAST  Result Date: 12/11/2019 CLINICAL DATA:  Acute onset of dizziness and nausea/vomiting. EXAM: MRI HEAD WITHOUT CONTRAST TECHNIQUE: Multiplanar, multiecho pulse sequences of the brain and surrounding structures were obtained without intravenous contrast. COMPARISON:  CT head 12/10/2019, MRI head 09/05/2018 FINDINGS: Brain: No acute infarction, hemorrhage, hydrocephalus, extra-axial collection or mass lesion. Similar encephalomalacia associated with prior right parietal lobe infarct. Similar susceptibility artifact associated with this infarct, compatible with prior hemorrhage. Vascular: Major arterial flow voids are maintained at the skull base. Skull and upper cervical spine: Normal marrow signal. Sinuses/Orbits: Mild scattered mucosal thickening. No air-fluid levels. Unremarkable orbits. IMPRESSION: 1. No evidence of acute intracranial abnormality. Specifically, no acute infarct. 2. Remote right parietal infarct. Electronically Signed   By: Margaretha Sheffield MD   On: 12/11/2019 08:00     ____________________________________________   PROCEDURES  Procedure(s) performed:yes .1-3 Lead EKG Interpretation Performed by: Rudene Re, MD Authorized by: Rudene Re, MD     Interpretation: non-specific     ECG rate assessment: normal     Rhythm: sinus rhythm     Ectopy:  none     Critical Care performed:  None ____________________________________________   INITIAL IMPRESSION / ASSESSMENT AND PLAN / ED COURSE  74 y.o. male with a history of CVA in 2020, diabetes, recurrent sinus infections, hypertension, OSA on CPAP, ankylosing spondylitis, sick sinus syndrome who presents for evaluation of dizziness/ feeling off balance for several hours associated with nausea and vomiting, not worse with movement. Patient is completely neurologically intact, TMs visualized and clear, no nystagmus, gait is normal although patient does report feeling slightly off balance with ambulation.  BP elevated but trending down without any interventions.  Heart regular rate and rhythm.  This is patient's third episode since the stroke in 2020.  Also has had recurrent sinus infection and is currently being managed by Dr. Pryor Ochoa from ENT.  He is complaining of a sinus-like headache today.  At this time this could be peripheral from his sinus infection but also since patient has had a stroke recently we will make sure that he does not have a cerebellar stroke.  Head CT was visualized by me with no acute findings, confirmed by radiology.  We will get an MRI.  EKG shows no signs of dysrhythmias.  Patient placed on telemetry for close monitor for any signs of dysrhythmias as a possible etiology.  Labs visualized by me showing troponin and a current baseline, no signs of anemia or leukocytosis, no signs of dehydration or significant electrolyte derangements, no signs of DKA.  Patient has been in the waiting room for 10 hours and has not had anything to eat or drink.  Will give IV fluids, meclizine, Tylenol for headache and Afrin for nasal decongestion.  Patient is already on an intranasal steroid and therefore will not start a different one at this time.  We will send patient for an MRI.  History gathered from patient his wife is at bedside, plan discussed with both of them.  Pretty extensive review of  patient's prior medical record including work-up by neurology and cardiology for these episodes of  dizziness.     _________________________ 7:00 AM on 12/11/2019 ----------------------------------------- MRI pending. Care transferred to Dr. Charlsie Quest   _____________________________________________ Please note:  Patient was evaluated in Emergency Department today for the symptoms described in the history of present illness. Patient was evaluated in the context of the global COVID-19 pandemic, which necessitated consideration that the patient might be at risk for infection with the SARS-CoV-2 virus that causes COVID-19. Institutional protocols and algorithms that pertain to the evaluation of patients at risk for COVID-19 are in a state of rapid change based on information released by regulatory bodies including the CDC and federal and state organizations. These policies and algorithms were followed during the patient's care in the ED.  Some ED evaluations and interventions may be delayed as a result of limited staffing during the pandemic.   [place] Controlled Substance Database was reviewed by me. ____________________________________________   FINAL CLINICAL IMPRESSION(S) / ED DIAGNOSES   Final diagnoses:  Dizziness  Benign paroxysmal positional vertigo, unspecified laterality      NEW MEDICATIONS STARTED DURING THIS VISIT:  ED Discharge Orders         Ordered    meclizine (ANTIVERT) 25 MG tablet  3 times daily PRN        12/11/19 0816    oxymetazoline (AFRIN) 0.05 % nasal spray  2 times daily PRN        12/11/19 0816           Note:  This document was prepared using Dragon voice recognition software and may include unintentional dictation errors.    Alfred Levins, [place], MD 12/12/19 [phone]

## 2019-12-18 ENCOUNTER — Inpatient Hospital Stay
Admission: EM | Admit: 2019-12-18 | Discharge: 2019-12-20 | DRG: 175 | Disposition: A | Discharge status: Home / Self Care | Payer: Medicare Other | Source: Ambulatory Visit | Attending: Internal Medicine | Admitting: Internal Medicine

## 2019-12-18 ENCOUNTER — Other Ambulatory Visit (HOSPITAL_COMMUNITY): Payer: Self-pay | Admitting: Internal Medicine

## 2019-12-18 ENCOUNTER — Ambulatory Visit
Admission: RE | Admit: 2019-12-18 | Discharge: 2019-12-18 | Disposition: A | Discharge status: Home / Self Care | Payer: Medicare Other | Source: Ambulatory Visit | Attending: Internal Medicine | Admitting: Internal Medicine

## 2019-12-18 ENCOUNTER — Other Ambulatory Visit: Payer: Self-pay

## 2019-12-18 ENCOUNTER — Other Ambulatory Visit: Payer: Self-pay | Admitting: Internal Medicine

## 2019-12-18 DIAGNOSIS — Z6830 Body mass index (BMI) 30.0-30.9, adult: Secondary | ICD-10-CM

## 2019-12-18 DIAGNOSIS — R519 Headache, unspecified: Secondary | ICD-10-CM | POA: Diagnosis present

## 2019-12-18 DIAGNOSIS — Z86711 Personal history of pulmonary embolism: Secondary | ICD-10-CM | POA: Insufficient documentation

## 2019-12-18 DIAGNOSIS — R7989 Other specified abnormal findings of blood chemistry: Secondary | ICD-10-CM

## 2019-12-18 DIAGNOSIS — M459 Ankylosing spondylitis of unspecified sites in spine: Secondary | ICD-10-CM | POA: Diagnosis present

## 2019-12-18 DIAGNOSIS — E1149 Type 2 diabetes mellitus with other diabetic neurological complication: Secondary | ICD-10-CM | POA: Diagnosis present

## 2019-12-18 DIAGNOSIS — Z794 Long term (current) use of insulin: Secondary | ICD-10-CM

## 2019-12-18 DIAGNOSIS — N400 Enlarged prostate without lower urinary tract symptoms: Secondary | ICD-10-CM | POA: Diagnosis present

## 2019-12-18 DIAGNOSIS — J45909 Unspecified asthma, uncomplicated: Secondary | ICD-10-CM | POA: Diagnosis present

## 2019-12-18 DIAGNOSIS — Z888 Allergy status to other drugs, medicaments and biological substances status: Secondary | ICD-10-CM | POA: Diagnosis not present

## 2019-12-18 DIAGNOSIS — Z8673 Personal history of transient ischemic attack (TIA), and cerebral infarction without residual deficits: Secondary | ICD-10-CM | POA: Diagnosis not present

## 2019-12-18 DIAGNOSIS — Z7982 Long term (current) use of aspirin: Secondary | ICD-10-CM | POA: Diagnosis not present

## 2019-12-18 DIAGNOSIS — J329 Chronic sinusitis, unspecified: Secondary | ICD-10-CM | POA: Diagnosis present

## 2019-12-18 DIAGNOSIS — Z7951 Long term (current) use of inhaled steroids: Secondary | ICD-10-CM | POA: Diagnosis not present

## 2019-12-18 DIAGNOSIS — I2699 Other pulmonary embolism without acute cor pulmonale: Secondary | ICD-10-CM

## 2019-12-18 DIAGNOSIS — Z87438 Personal history of other diseases of male genital organs: Secondary | ICD-10-CM | POA: Diagnosis not present

## 2019-12-18 DIAGNOSIS — M109 Gout, unspecified: Secondary | ICD-10-CM | POA: Diagnosis present

## 2019-12-18 DIAGNOSIS — G4733 Obstructive sleep apnea (adult) (pediatric): Secondary | ICD-10-CM | POA: Diagnosis present

## 2019-12-18 DIAGNOSIS — M170 Bilateral primary osteoarthritis of knee: Secondary | ICD-10-CM | POA: Diagnosis present

## 2019-12-18 DIAGNOSIS — Z20822 Contact with and (suspected) exposure to covid-19: Secondary | ICD-10-CM | POA: Diagnosis present

## 2019-12-18 DIAGNOSIS — E7800 Pure hypercholesterolemia, unspecified: Secondary | ICD-10-CM | POA: Diagnosis not present

## 2019-12-18 DIAGNOSIS — R0602 Shortness of breath: Secondary | ICD-10-CM

## 2019-12-18 DIAGNOSIS — R7401 Elevation of levels of liver transaminase levels: Secondary | ICD-10-CM

## 2019-12-18 DIAGNOSIS — D649 Anemia, unspecified: Secondary | ICD-10-CM | POA: Diagnosis present

## 2019-12-18 DIAGNOSIS — M19042 Primary osteoarthritis, left hand: Secondary | ICD-10-CM | POA: Diagnosis present

## 2019-12-18 DIAGNOSIS — I2609 Other pulmonary embolism with acute cor pulmonale: Secondary | ICD-10-CM | POA: Diagnosis not present

## 2019-12-18 DIAGNOSIS — E785 Hyperlipidemia, unspecified: Secondary | ICD-10-CM | POA: Diagnosis present

## 2019-12-18 DIAGNOSIS — M19041 Primary osteoarthritis, right hand: Secondary | ICD-10-CM | POA: Diagnosis present

## 2019-12-18 DIAGNOSIS — Z79899 Other long term (current) drug therapy: Secondary | ICD-10-CM | POA: Diagnosis not present

## 2019-12-18 DIAGNOSIS — J4520 Mild intermittent asthma, uncomplicated: Secondary | ICD-10-CM | POA: Diagnosis not present

## 2019-12-18 DIAGNOSIS — I10 Essential (primary) hypertension: Secondary | ICD-10-CM | POA: Diagnosis present

## 2019-12-18 DIAGNOSIS — I472 Ventricular tachycardia: Secondary | ICD-10-CM | POA: Diagnosis present

## 2019-12-18 DIAGNOSIS — I2694 Multiple subsegmental pulmonary emboli without acute cor pulmonale: Secondary | ICD-10-CM | POA: Diagnosis not present

## 2019-12-18 DIAGNOSIS — R12 Heartburn: Secondary | ICD-10-CM | POA: Diagnosis present

## 2019-12-18 DIAGNOSIS — E669 Obesity, unspecified: Secondary | ICD-10-CM | POA: Diagnosis present

## 2019-12-18 DIAGNOSIS — E1140 Type 2 diabetes mellitus with diabetic neuropathy, unspecified: Secondary | ICD-10-CM | POA: Diagnosis not present

## 2019-12-18 HISTORY — DX: Other pulmonary embolism without acute cor pulmonale: I26.99

## 2019-12-18 LAB — CBC
HCT: 52.3 % — ABNORMAL HIGH (ref 39.0–52.0)
Hemoglobin: 16.5 g/dL (ref 13.0–17.0)
MCH: 29.2 pg (ref 26.0–34.0)
MCHC: 31.5 g/dL (ref 30.0–36.0)
MCV: 92.4 fL (ref 80.0–100.0)
Platelets: 151 10*3/uL (ref 150–400)
RBC: 5.66 MIL/uL (ref 4.22–5.81)
RDW: 13.9 % (ref 11.5–15.5)
WBC: 7.8 10*3/uL (ref 4.0–10.5)
nRBC: 0 % (ref 0.0–0.2)

## 2019-12-18 LAB — APTT: aPTT: 33 seconds (ref 24–36)

## 2019-12-18 LAB — COMPREHENSIVE METABOLIC PANEL
ALT: 49 U/L — ABNORMAL HIGH (ref 0–44)
AST: 55 U/L — ABNORMAL HIGH (ref 15–41)
Albumin: 4 g/dL (ref 3.5–5.0)
Alkaline Phosphatase: 180 U/L — ABNORMAL HIGH (ref 38–126)
Anion gap: 11 (ref 5–15)
BUN: 18 mg/dL (ref 8–23)
CO2: 26 mmol/L (ref 22–32)
Calcium: 8.6 mg/dL — ABNORMAL LOW (ref 8.9–10.3)
Chloride: 105 mmol/L (ref 98–111)
Creatinine, Ser: 1.26 mg/dL — ABNORMAL HIGH (ref 0.61–1.24)
GFR, Estimated: >60 mL/min (ref >60)
Glucose, Bld: 116 mg/dL — ABNORMAL HIGH (ref 70–99)
Potassium: 4.4 mmol/L (ref 3.5–5.1)
Sodium: 142 mmol/L (ref 135–145)
Total Bilirubin: 1.3 mg/dL — ABNORMAL HIGH (ref 0.3–1.2)
Total Protein: 7.5 g/dL (ref 6.5–8.1)

## 2019-12-18 LAB — CBG MONITORING, ED: Glucose-Capillary: 104 mg/dL — ABNORMAL HIGH (ref 70–99)

## 2019-12-18 LAB — POCT I-STAT CREATININE: Creatinine, Ser: 1.3 mg/dL — ABNORMAL HIGH (ref 0.61–1.24)

## 2019-12-18 LAB — BRAIN NATRIURETIC PEPTIDE: B Natriuretic Peptide: 36.6 pg/mL (ref 0.0–100.0)

## 2019-12-18 LAB — TROPONIN I (HIGH SENSITIVITY)
Troponin I (High Sensitivity): 33 ng/L — ABNORMAL HIGH (ref <18)
Troponin I (High Sensitivity): 46 ng/L — ABNORMAL HIGH (ref <18)

## 2019-12-18 LAB — RESPIRATORY PANEL BY RT PCR (FLU A&B, COVID)
Influenza A by PCR: NEGATIVE
Influenza B by PCR: NEGATIVE
SARS Coronavirus 2 by RT PCR: NEGATIVE

## 2019-12-18 LAB — PROTIME-INR
INR: 1.1 (ref 0.8–1.2)
Prothrombin Time: 13.8 seconds (ref 11.4–15.2)

## 2019-12-18 MED ORDER — SODIUM CHLORIDE 0.9 % IV SOLN: INTRAVENOUS | Status: DC

## 2019-12-18 MED ORDER — ACETAMINOPHEN 650 MG RE SUPP: 650.0000 mg | Freq: Four times a day (QID) | RECTAL | Status: DC | PRN

## 2019-12-18 MED ORDER — INSULIN ASPART 100 UNIT/ML ~~LOC~~ SOLN: 0.0000 [IU] | Freq: Every day | SUBCUTANEOUS | Status: DC

## 2019-12-18 MED ORDER — INSULIN ASPART 100 UNIT/ML ~~LOC~~ SOLN
0.0000 [IU] | Freq: Three times a day (TID) | SUBCUTANEOUS | Status: DC
  Filled 2019-12-18: qty 1

## 2019-12-18 MED ORDER — HEPARIN BOLUS VIA INFUSION
6000.0000 [IU] | Freq: Once | INTRAVENOUS | Status: AC
  Administered 2019-12-18: 6000 [IU] via INTRAVENOUS
  Filled 2019-12-18: qty 6000

## 2019-12-18 MED ORDER — HYDROMORPHONE HCL 1 MG/ML IJ SOLN: 0.5000 mg | INTRAMUSCULAR | Status: DC | PRN

## 2019-12-18 MED ORDER — ONDANSETRON HCL 4 MG/2ML IJ SOLN: 4.0000 mg | Freq: Four times a day (QID) | INTRAMUSCULAR | Status: DC | PRN

## 2019-12-18 MED ORDER — HEPARIN (PORCINE) 25000 UT/250ML-% IV SOLN
1500.0000 [IU]/h | INTRAVENOUS | Status: DC
  Administered 2019-12-18: 1700 [IU]/h via INTRAVENOUS
  Administered 2019-12-19: 1500 [IU]/h via INTRAVENOUS
  Filled 2019-12-18 (×2): qty 250

## 2019-12-18 MED ORDER — ONDANSETRON HCL 4 MG PO TABS: 4.0000 mg | ORAL_TABLET | Freq: Four times a day (QID) | ORAL | Status: DC | PRN

## 2019-12-18 MED ORDER — IOHEXOL 350 MG/ML SOLN
75.0000 mL | Freq: Once | INTRAVENOUS | Status: AC | PRN
  Administered 2019-12-18: 75 mL via INTRAVENOUS

## 2019-12-18 MED ORDER — ACETAMINOPHEN 325 MG PO TABS
650.0000 mg | ORAL_TABLET | Freq: Four times a day (QID) | ORAL | Status: DC | PRN
  Administered 2019-12-19: 650 mg via ORAL
  Filled 2019-12-18: qty 2

## 2019-12-18 NOTE — Progress Notes (Signed)
[place] for heparin Indication: pulmonary embolus  Allergies  Allergen Reactions  . Metformin Diarrhea    Patient Measurements: Height: 6\' 2"  (188 cm) Weight: 108.4 kg (239 lb) IBW/kg (Calculated) : 82.2 Heparin Dosing Weight: 104 kg  Vital Signs: Temp: 99.3 F (37.4 C) (11/03 1845) Temp Source: Oral (11/03 1845) BP: 154/108 (11/03 1919) Pulse Rate: 80 (11/03 1919)  Labs: Recent Labs    12/18/19 1750 12/18/19 1858  HGB  --  16.5  HCT  --  52.3*  PLT  --  151  CREATININE 1.30* 1.26*  TROPONINIHS  --  33*    Estimated Creatinine Clearance: 68.5 mL/min (A) (by C-G formula based on SCr of 1.26 mg/dL (H)).   Medical History: Past Medical History:  Diagnosis Date  . Ankylosing spondylitis ([place])   . Arthritis    knee,hands,back and neck  . Asthma    LAST ATTACK 4-5 YRS AGO  . BPH (benign prostatic hyperplasia)   . CVA (cerebral vascular accident) ([place])    08-20-2018 per patient  . Diabetes mellitus without complication ([place])    TYPE 2  . Gout   . H/O multiple allergies    SEASONAL,ENVIRONMENTAL  . H/O sinus surgery    x4  . Headache    sinus  . Hypertension   . Rotator cuff tear    left shoulder  . Sleep apnea    C-PAP     Assessment: 74 year old male presented after having imaging done earlier today positive for acute PE with moderate clot burden and evidence of right heart strain. Eliquis listed as PTA med. Called ED RN and discussed with patient. He states he was taken off Eliquis months ago and has not taken it since. Pharmacy consulted for heparin drip.  Goal of Therapy:  Heparin level 0.3-0.7 units/ml Monitor platelets by anticoagulation protocol: Yes   Plan:  Heparin 6000 unit bolus followed by heparin drip at 1700 units/hr. HL tomorrow morning at 0400. CBC daily while on heparin drip.  Tawnya Crook, PharmD 12/18/2019,7:38 PM

## 2019-12-18 NOTE — ED Notes (Signed)
Labs verbal order from [facility].

## 2019-12-18 NOTE — ED Provider Notes (Signed)
[facility] Emergency Department Provider Note  ____________________________________________   First MD Initiated Contact with Patient 12/18/19 1908     (approximate)  I have reviewed the triage vital signs and the nursing notes.   HISTORY  Chief Complaint confirmed blood clot   HPI Jason Doyle is a 74 y.o. male with a history of CVA in 2020, diabetes, recurrent sinus infections, hypertension, OSA on CPAP, ankylosing spondylitis, sick sinus syndrome and previously on Eliquis secondary to CVA not currently anticoagulated who presents for assessment to the ED after being referred by his PCP after a CT obtained today on outpatient basis was concerning for large PE.  Patient states he was seeing his PCP yesterday for dizziness that has been bothering him for several weeks and his PCP seem more concerned about possible blood clot as patient states he has had some shortness of breath over the last for 5 days.  He has never had a pulmonary blood clot before.  He denies any recent surgeries, tobacco use, or travel other than last month going to [facility].  He denies any chest pain, headache, earache, sore throat, dental pain, back pain but does endorse a little bit of nonproductive cough for the last several days.  He denies any urinary symptoms, rash, extremity pain, recent falls or injuries.  Denies EtOH use, illicit drug use, tobacco abuse.     Past Medical History:  Diagnosis Date  . Ankylosing spondylitis ([place])   . Arthritis    knee,hands,back and neck  . Asthma    LAST ATTACK 4-5 YRS AGO  . BPH (benign prostatic hyperplasia)   . CVA (cerebral vascular accident) ([place])    08-20-2018 per patient  . Diabetes mellitus without complication ([place])    TYPE 2  . Gout   . H/O multiple allergies    SEASONAL,ENVIRONMENTAL  . H/O sinus surgery    x4  . Headache    sinus  . Hypertension   . Rotator cuff tear    left shoulder  . Sleep apnea    C-PAP     Patient Active Problem List   Diagnosis Date Noted  . Ventricular tachyarrhythmia ([place]) 01/09/2019  . Stroke (cerebrum) ([place]) 08/20/2018    Past Surgical History:  Procedure Laterality Date  . BICEPT TENODESIS Left 07/23/2014   Procedure: BICEPS TENODESIS;  Surgeon: [name], MD;  Location: [place];  Service: Orthopedics;  Laterality: Left;  . COLONOSCOPY WITH PROPOFOL N/A 04/10/2017   Procedure: COLONOSCOPY WITH PROPOFOL;  Surgeon: [name], MD;  Location: [facility];  Service: Endoscopy;  Laterality: N/A;  . KNEE ARTHROSCOPY    . LOOP RECORDER INSERTION N/A 11/20/2018   Procedure: LOOP RECORDER INSERTION;  Surgeon: Isaias Cowman, MD;  Location: [place] CV [facility];  Service: Cardiovascular;  Laterality: N/A;  . NASAL SINUS SURGERY     x 4  . SHOULDER ARTHROSCOPY WITH ROTATOR CUFF REPAIR AND SUBACROMIAL DECOMPRESSION Left 07/23/2014   Procedure: SHOULDER ARTHROSCOPY WITH ROTATOR CUFF REPAIR AND SUBACROMIAL DECOMPRESSION;  Surgeon: [name], MD;  Location: [place];  Service: Orthopedics;  Laterality: Left;  . UVULOPALATOPHARYNGOPLASTY      Prior to Admission medications   Medication Sig Start Date End Date Taking? Authorizing Provider  acetaminophen (TYLENOL) 500 MG tablet Take 500-1,000 mg by mouth every 6 (six) hours as needed (pain).     [provider]  albuterol (PROVENTIL HFA;VENTOLIN HFA) 108 (90 BASE) MCG/ACT inhaler Inhale 2 puffs into the  lungs as needed for wheezing or shortness of breath.    [provider]  allopurinol (ZYLOPRIM) 100 MG tablet Take 100 mg by mouth daily. am    [provider]  amLODipine (NORVASC) 10 MG tablet Take 10 mg by mouth daily.  07/25/18   [provider]  apixaban (ELIQUIS) 5 MG TABS tablet Take 1 tablet (5 mg total) by mouth 2 (two) times daily. 01/09/19   Harvest Dark, MD  aspirin 81 MG chewable tablet Chew 1 tablet (81 mg total) by mouth daily. Start  on 08/31/2018 no earlier then that please thank you 08/31/18   Bettey Costa, MD  atorvastatin (LIPITOR) 80 MG tablet Take 40 mg by mouth every evening.     [provider]  Azelastine-Fluticasone 137-50 MCG/ACT SUSP Place 1 spray into the nose 2 (two) times daily as needed (congestion).  03/17/15   [provider]  budesonide (PULMICORT) 0.5 MG/2ML nebulizer solution Place 10 mLs into both nostrils 2 (two) times a day. Add 10 ml (one dose) of medication to 240 ml of saline in sinus rinse bottle. Irrigate sinuses with 120 ml through each nostril twice daily 04/23/18   [provider]  budesonide-formoterol (SYMBICORT) 160-4.5 MCG/ACT inhaler Inhale 2 puffs into the lungs 2 (two) times daily.     [provider]  Cholecalciferol (VITAMIN D3) 50 MCG (2000 UT) TABS Take 2,000 Units by mouth daily.     [provider]  empagliflozin (JARDIANCE) 10 MG TABS tablet Take 10 mg by mouth daily.    [provider]  ENBREL SURECLICK 50 MG/ML injection Inject 50 mg into the skin every Wednesday. 07/27/18   [provider]  glipiZIDE (GLUCOTROL XL) 2.5 MG 24 hr tablet Take 2.5 mg by mouth daily with breakfast.    [provider]  labetalol (NORMODYNE) 100 MG tablet Take 1 tablet (100 mg total) by mouth 2 (two) times daily. 01/09/19 04/09/19  Harvest Dark, MD  lisinopril (PRINIVIL,ZESTRIL) 40 MG tablet Take 40 mg by mouth daily.    [provider]  loratadine (CLARITIN) 10 MG tablet Take 10 mg by mouth daily.    [provider]  meclizine (ANTIVERT) 25 MG tablet Take 1 tablet (25 mg total) by mouth 3 (three) times daily as needed for dizziness. 12/11/19   Vladimir Crofts, MD  montelukast (SINGULAIR) 10 MG tablet Take 10 mg by mouth at bedtime.    [provider]  oxybutynin (DITROPAN) 5 MG tablet Take 5 mg by mouth 2 (two) times daily. 07/25/18   [provider]  oxymetazoline (AFRIN) 0.05 % nasal spray Place 2  sprays into both nostrils 2 (two) times daily as needed (nasal congestion). 12/11/19 12/10/20  Vladimir Crofts, MD  tamsulosin (FLOMAX) 0.4 MG CAPS capsule Take 0.4 mg by mouth daily after breakfast.    [provider]  tiZANidine (ZANAFLEX) 4 MG tablet Take 4 mg by mouth at bedtime. 07/25/18   [provider]    Allergies Metformin  History reviewed. No pertinent family history.  Social History Social History   Tobacco Use  . Smoking status: Never Smoker  . Smokeless tobacco: Never Used  Vaping Use  . Vaping Use: Never used  Substance Use Topics  . Alcohol use: Not Currently    Comment: RARELY "1/2 glass wine a year"  . Drug use: Not Currently    Review of Systems  Review of Systems  Constitutional: Negative for chills and fever.  HENT: Negative for sore throat.  Eyes: Negative for pain.  Respiratory: Positive for cough and shortness of breath. Negative for stridor.   Cardiovascular: Negative for chest pain.  Gastrointestinal: Negative for vomiting.  Skin: Negative for rash.  Neurological: Negative for seizures, loss of consciousness and headaches.  Psychiatric/Behavioral: Negative for suicidal ideas.  All other systems reviewed and are negative.     ____________________________________________   PHYSICAL EXAM:  VITAL SIGNS: ED Triage Vitals  Enc Vitals Group     BP 12/18/19 1845 (!) 192/104     Pulse Rate 12/18/19 1845 75     Resp 12/18/19 1845 16     Temp 12/18/19 1845 99.3 F (37.4 C)     Temp Source 12/18/19 1845 Oral     SpO2 12/18/19 1845 98 %     Weight 12/18/19 1845 239 lb (108.4 kg)     Height 12/18/19 1845 6\' 2"  (1.88 m)     Head Circumference --      Peak Flow --      Pain Score 12/18/19 1851 0     Pain Loc --      Pain Edu? --      Excl. in [place]? --    Vitals:   12/18/19 1845 12/18/19 1919  BP: (!) 192/104 (!) 154/108  Pulse: 75 80  Resp: 16 18  Temp: 99.3 F (37.4 C)   SpO2: 98% 98%   Physical Exam Vitals and nursing  note reviewed.  Constitutional:      Appearance: He is well-developed.  HENT:     Head: Normocephalic and atraumatic.     Right Ear: External ear normal.     Left Ear: External ear normal.     Nose: Nose normal.  Eyes:     Conjunctiva/sclera: Conjunctivae normal.  Cardiovascular:     Rate and Rhythm: Normal rate and regular rhythm.     Heart sounds: No murmur heard.   Pulmonary:     Effort: Pulmonary effort is normal. No respiratory distress.     Breath sounds: Normal breath sounds.  Abdominal:     Palpations: Abdomen is soft.     Tenderness: There is no abdominal tenderness.  Musculoskeletal:     Cervical back: Neck supple.  Skin:    General: Skin is warm and dry.     Capillary Refill: Capillary refill takes less than 2 seconds.  Neurological:     Mental Status: He is alert and oriented to person, place, and time.  Psychiatric:        Mood and Affect: Mood normal.      ____________________________________________   LABS (all labs ordered are listed, but only abnormal results are displayed)  Labs Reviewed  CBC - Abnormal; Notable for the following components:      Result Value   HCT 52.3 (*)    All other components within normal limits  COMPREHENSIVE METABOLIC PANEL - Abnormal; Notable for the following components:   Glucose, Bld 116 (*)    Creatinine, Ser 1.26 (*)    Calcium 8.6 (*)    AST 55 (*)    ALT 49 (*)    Alkaline Phosphatase 180 (*)    Total Bilirubin 1.3 (*)    All other components within normal limits  TROPONIN I (HIGH SENSITIVITY) - Abnormal; Notable for the following components:   Troponin I (High Sensitivity) 33 (*)    All other components within normal limits  RESPIRATORY PANEL BY RT PCR (FLU A&B, COVID)  BRAIN NATRIURETIC PEPTIDE  APTT  PROTIME-INR  HEPARIN LEVEL (UNFRACTIONATED)  CBC   ____________________________________________  EKG  Sinus rhythm with a PVC, ventricular rate of 78, normal axis, unremarkable intervals, nonspecific ST  change in lead I and aVL with no other clear evidence of acute ischemia. ____________________________________________  RADIOLOGY   Official radiology report(s): CT ANGIO CHEST PE W OR WO CONTRAST  Result Date: 12/18/2019 CLINICAL DATA:  Shortness of breath and elevated D-dimer. EXAM: CT ANGIOGRAPHY CHEST WITH CONTRAST TECHNIQUE: Multidetector CT imaging of the chest was performed using the standard protocol during bolus administration of intravenous contrast. Multiplanar CT image reconstructions and MIPs were obtained to evaluate the vascular anatomy. CONTRAST:  75mL OMNIPAQUE IOHEXOL 350 MG/ML SOLN COMPARISON:  Report from radiograph earlier today. Images not available. FINDINGS: Cardiovascular: Positive for acute pulmonary embolus with filling defect involving the distal main pulmonary artery extending into the upper and lower lobar branches. Lesser pulmonary emboli on the right. Segmental filling defect in the right upper lobe, series 4, image 44. There is subsegmental filling defects in the right lower lobe, series 4 images 59 and 71. There is slight straightening of the intraventricular septum and right heart strain with RV to LV ratio of 1.1. Mild cardiomegaly. Mild aortic atherosclerosis without aortic dissection. Occasional coronary artery calcifications. Trace pericardial effusion. Mediastinum/Nodes: Scattered small mediastinal lymph nodes are not enlarged by size criteria. There is no hilar adenopathy. Patulous esophagus without wall thickening. No visualized thyroid nodule. Lungs/Pleura: There are scattered nodular densities within the left lower lobe and right middle lobe, all measuring 9-10 mm with indistinct margins and slight surrounding ground-glass. Trace left pleural effusion. Linear atelectasis in the lingula. No findings of pulmonary edema. Upper Abdomen: Nodular hepatic contours suspicious for cirrhosis. No acute upper abdominal findings. Musculoskeletal: There is thin flowing  syndesmophytes throughout the thoracic spine with slight spurring of the vertebral bodies typical of ankylosing spondylitis. There is also fusion of the spinous processes. No fracture or acute osseous abnormalities. Review of the MIP images confirms the above findings. IMPRESSION: 1. Positive for acute pulmonary embolus with moderate clot burden and evidence of right heart strain, RV to LV ratio of 1.1. Greatest thromboembolic burden is on the left and involves the distal main left pulmonary artery. 2. Scattered nodular densities within the left lower lobe and right middle lobe, all measuring 9-10 mm with indistinct margins and slight surrounding ground-glass. Findings may be infectious or inflammatory, however recommend follow-up chest CT in 3 months to ensure resolution. Possibility of septic emboli is raised. 3. Trace left pleural effusion. 4. 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 5. Ankylosing spondylitis. Aortic Atherosclerosis (ICD10-I70.0). Critical Value/emergent results were called by telephone at the time of interpretation on 12/18/2019 at 6:14 pm to Dr Dion Body , who verbally acknowledged these results. The patient was asked remain in the CT department by technologists, and referred directly to the emergency room for treatment. Electronically Signed   By: Keith Rake M.D.   On: 12/18/2019 18:15    ____________________________________________   PROCEDURES  Procedure(s) performed (including Critical Care):  .Critical Care Performed by: Lucrezia Starch, MD Authorized by: Lucrezia Starch, MD   Critical care provider statement:    Critical care time (minutes):  45   Critical care was necessary to treat or prevent imminent or life-threatening deterioration of the following conditions:  Cardiac failure   Critical care was time spent personally by me on the following activities:  Discussions with consultants, evaluation of patient's response to treatment,  examination of  patient, ordering and performing treatments and interventions, ordering and review of laboratory studies, ordering and review of radiographic studies, pulse oximetry, re-evaluation of patient's condition, obtaining history from patient or surrogate, review of old charts and development of treatment plan with patient or surrogate     ____________________________________________   INITIAL IMPRESSION / ASSESSMENT AND PLAN / ED COURSE        Patient presents with [place] to history exam for assessment after an outpatient CTA obtained today showed evidence of PE with right heart strain.  Patient states he saw his PCP yesterday for assessment of several weeks of dizziness as well as some shortness of breath over the last for 5 days.  On arrival patient is hypertensive with a BP of 192/104 with otherwise stable vital signs on room air.  While patient does state he took a trip to [facility] several weeks ago he has no other clear precipitating events her PE.  Given evidence of right heart strain on CTA , I did consult interventional vascular surgeon Dr. Lucky Cowboy who stated they would likely perform endovascular thrombolysis tomorrow morning. He agreed with initiating heparin in the ED and admitting to medicine service.   Patient is also noted on his CTh to have multiple nodular densities which could be either infectious or possible bleed malignant.  However patient is not febrile and has no elevation of his white blood cell count or other clear infectious symptoms.  His troponin is slightly elevated at 33 although this appears to be close to patient's baseline as it was 39 8 days ago and 33 7 months ago.  CMP shows evidence of slight decreased kidney function with a creatinine of 1.26 compared to 1.068 days ago as well as a transaminitis with AST of 55 and ALT of 49 which were also noted on CMP 8 days ago.  No other significant electrolyte or metabolic derangements.  BNP is 36 there is no significant  evidence of volume overload on CT imaging of chest.  I suspect patient's dyspnea is likely secondary to his PE.  I did start heparin in emergency room and I will plan to admit to hospital service for further evaluation management.      ____________________________________________   FINAL CLINICAL IMPRESSION(S) / ED DIAGNOSES  Final diagnoses:  Other acute pulmonary embolism with acute cor pulmonale (HCC)  Transaminitis  Hypertension, unspecified type  Anemia, unspecified type    Medications  heparin bolus via infusion 6,000 Units (has no administration in time range)  heparin ADULT infusion 100 units/mL (25000 units/248mL sodium chloride 0.45%) (has no administration in time range)     ED Discharge Orders    None       Note:  This document was prepared using Dragon voice recognition software and may include unintentional dictation errors.   Lucrezia Starch, MD 12/18/19 Karl B[place]

## 2019-12-18 NOTE — H&P (Signed)
History and Physical   SIDDHARTH BABINGTON KVQ:[id number] DOB: [birth year] DOA: 12/18/2019  Referring MD/NP/PA: Dr. Gardenia Phlegm  PCP: Adin Hector, MD   Outpatient Specialists: None  Patient coming from: Home  Chief Complaint: Pulmonary embolism  HPI: Jason Doyle is a 74 y.o. male with medical history significant of diabetes, hypertension, BPH, ankylosing spondylitis, sleep apnea on CPAP, seasonal allergies, history of CVA and gout who was previously on Eliquis after his stroke.  Patient has been off anticoagulation.  He apparently has been very active until recently when he started having drop in his blood pressure with activities.  This started about a week ago.  He therefore decrease his physical activities.  He was walking as far as [address] [place] prior to that.  He continues to feel weak and dizzy.  The dizziness has been on and off but has become a little more persistent in the last 2 days.  He went to see his PCP today where work-up was done including CT angiogram of the chest that showed significant PE.  Patient therefore sent to the ER for evaluation and treatment.  He denied any chest pain.  He denied any family history of blood clots.  Denied any recent surgery.  Patient is seen and evaluated at not being admitted to the hospital with acute bilateral pulmonary embolus with labs clot burden and right ventricular strain..  ED Course: Temperature 99.3 blood pressure 192/104 pulse 80 respiratory 24 oxygen sat 95% room air.  White count 7.8 hemoglobin 16.5 and platelet 151.  Creatinine is 1.26 calcium 8.6 glucose 116 the rest of the chemistry appeared to be within normal.  Respiratory panel is negative.  CT angiogram of the chest shows positive for acute PE with moderate clot burden and evidence of right heart strain.  There was scattered nodular density within the left lower lobe or right upper middle lobe.  All of 9 to 10 mm.  Also trace left pleural effusion and possibly hepatic cirrhosis and  ankylosing spondylitis.  Patient being admitted to the hospital for further treatment and initiated on heparin drip  Review of Systems: As per HPI otherwise 10 point review of systems negative.    Past Medical History:  Diagnosis Date  . Ankylosing spondylitis ([place])   . Arthritis    knee,hands,back and neck  . Asthma    LAST ATTACK 4-5 YRS AGO  . BPH (benign prostatic hyperplasia)   . CVA (cerebral vascular accident) ([place])    08-20-2018 per patient  . Diabetes mellitus without complication ([place])    TYPE 2  . Gout   . H/O multiple allergies    SEASONAL,ENVIRONMENTAL  . H/O sinus surgery    x4  . Headache    sinus  . Hypertension   . Rotator cuff tear    left shoulder  . Sleep apnea    C-PAP    Past Surgical History:  Procedure Laterality Date  . BICEPT TENODESIS Left 07/23/2014   Procedure: BICEPS TENODESIS;  Surgeon: [name], MD;  Location: [facility];  Service: Orthopedics;  Laterality: Left;  . COLONOSCOPY WITH PROPOFOL N/A 04/10/2017   Procedure: COLONOSCOPY WITH PROPOFOL;  Surgeon: [name], MD;  Location[facility] ENDOSCOPY;  Service: Endoscopy;  Laterality: N/A;  . KNEE ARTHROSCOPY    . LOOP RECORDER INSERTION N/A 11/20/2018   Procedure: LOOP RECORDER INSERTION;  Surgeon: Isaias Cowman, MD;  Location: [place] CV LAB;  Service: Cardiovascular;  Laterality: N/A;  . NASAL SINUS  SURGERY     x 4  . SHOULDER ARTHROSCOPY WITH ROTATOR CUFF REPAIR AND SUBACROMIAL DECOMPRESSION Left 07/23/2014   Procedure: SHOULDER ARTHROSCOPY WITH ROTATOR CUFF REPAIR AND SUBACROMIAL DECOMPRESSION;  Surgeon: [name], MD;  Location: [place];  Service: Orthopedics;  Laterality: Left;  . UVULOPALATOPHARYNGOPLASTY       reports that he has never smoked. He has never used smokeless tobacco. He reports previous alcohol use. He reports previous drug use.  Allergies  Allergen Reactions  . Metformin Diarrhea    History reviewed. No pertinent family  history.   Prior to Admission medications   Medication Sig Start Date End Date Taking? Authorizing Provider  allopurinol (ZYLOPRIM) 100 MG tablet Take 100 mg by mouth daily. am   Yes [provider]  amLODipine (NORVASC) 5 MG tablet Take 5 mg by mouth daily at 6 (six) AM. 11/20/19  Yes [provider]  aspirin 81 MG chewable tablet Chew 1 tablet (81 mg total) by mouth daily. Start on 08/31/2018 no earlier then that please thank you 08/31/18  Yes Mody, Ulice Bold, MD  atorvastatin (LIPITOR) 80 MG tablet Take 40 mg by mouth every evening.    Yes [provider]  ENBREL SURECLICK 50 MG/ML injection Inject 50 mg into the skin every Wednesday. 07/27/18  Yes [provider]  glipiZIDE (GLUCOTROL XL) 2.5 MG 24 hr tablet Take 2.5 mg by mouth daily with breakfast.   Yes [provider]  JARDIANCE 25 MG TABS tablet Take 25 mg by mouth daily at 6 (six) AM. 11/20/19  Yes [provider]  LACTASE ENZYME 3000 units tablet Take 3,000 Units by mouth daily at 6 (six) AM. 11/20/19  Yes [provider]  lisinopril (PRINIVIL,ZESTRIL) 40 MG tablet Take 40 mg by mouth daily.   Yes [provider]  loratadine (CLARITIN) 10 MG tablet Take 10 mg by mouth daily.   Yes [provider]  metaxalone (SKELAXIN) 800 MG tablet Take 800 mg by mouth 3 (three) times daily. 12/17/19  Yes [provider]  montelukast (SINGULAIR) 10 MG tablet Take 10 mg by mouth at bedtime.   Yes [provider]  oxybutynin (DITROPAN) 5 MG tablet Take 5 mg by mouth 2 (two) times daily. 07/25/18  Yes [provider]  tamsulosin (FLOMAX) 0.4 MG CAPS capsule Take 0.4 mg by mouth daily after breakfast.   Yes [provider]  tiZANidine (ZANAFLEX) 4 MG tablet Take 4 mg by mouth at bedtime. 07/25/18  Yes [provider]  Grant Ruts INHUB 250-50 MCG/DOSE AEPB Inhale 1 puff into the lungs 2 (two) times daily. 11/20/19  Yes [provider]   Azelastine-Fluticasone 137-50 MCG/ACT SUSP Place 1 spray into the nose 2 (two) times daily as needed (congestion).  03/17/15   [provider]  budesonide-formoterol (SYMBICORT) 160-4.5 MCG/ACT inhaler Inhale 2 puffs into the lungs 2 (two) times daily.  Patient not taking: Reported on 12/18/2019    [provider]  meclizine (ANTIVERT) 25 MG tablet Take 1 tablet (25 mg total) by mouth 3 (three) times daily as needed for dizziness. Patient not taking: Reported on 12/18/2019 12/11/19   Vladimir Crofts, MD  oxymetazoline (AFRIN) 0.05 % nasal spray Place 2 sprays into both nostrils 2 (two) times daily as needed (nasal congestion). 12/11/19 12/10/20  Vladimir Crofts, MD    Physical Exam: Vitals:   12/18/19 1845 12/18/19 1919  BP: (!) 192/104 (!) 154/108  Pulse: 75 80  Resp: 16 18  Temp: 99.3 F (  37.4 C)   TempSrc: Oral   SpO2: 98% 98%  Weight: 108.4 kg   Height: 6\' 2"  (1.88 m)       Constitutional: Anxious, no distress Vitals:   12/18/19 1845 12/18/19 1919  BP: (!) 192/104 (!) 154/108  Pulse: 75 80  Resp: 16 18  Temp: 99.3 F (37.4 C)   TempSrc: Oral   SpO2: 98% 98%  Weight: 108.4 kg   Height: 6\' 2"  (1.88 m)    Eyes: PERRL, lids and conjunctivae normal ENMT: Mucous membranes are moist. Posterior pharynx clear of any exudate or lesions.Normal dentition.  Neck: normal, supple, no masses, no thyromegaly Respiratory: Coarse breath sounds bilaterally, no wheezing, no crackles. Normal respiratory effort. No accessory muscle use.  Cardiovascular: Sinus tachycardia no murmurs / rubs / gallops. No extremity edema. 2+ pedal pulses. No carotid bruits.  Abdomen: no tenderness, no masses palpated. No hepatosplenomegaly. Bowel sounds positive.  Musculoskeletal: no clubbing / cyanosis. No joint deformity upper and lower extremities. Good ROM, no contractures. Normal muscle tone.  Skin: no rashes, lesions, ulcers. No induration Neurologic: CN 2-12 grossly intact. Sensation intact,  DTR normal. Strength 5/5 in all 4.  Psychiatric: Normal judgment and insight. Alert and oriented x 3. Normal mood.     Labs on Admission: I have personally reviewed following labs and imaging studies  CBC: Recent Labs  Lab 12/18/19 1858  WBC 7.8  HGB 16.5  HCT 52.3*  MCV 92.4  PLT 631   Basic Metabolic Panel: Recent Labs  Lab 12/18/19 1750 12/18/19 1858  NA  --  142  K  --  4.4  CL  --  105  CO2  --  26  GLUCOSE  --  116*  BUN  --  18  CREATININE 1.30* 1.26*  CALCIUM  --  8.6*   GFR: Estimated Creatinine Clearance: 68.5 mL/min (A) (by C-G formula based on SCr of 1.26 mg/dL (H)). Liver Function Tests: Recent Labs  Lab 12/18/19 1858  AST 55*  ALT 49*  ALKPHOS 180*  BILITOT 1.3*  PROT 7.5  ALBUMIN 4.0   No results for input(s): LIPASE, AMYLASE in the last 168 hours. No results for input(s): AMMONIA in the last 168 hours. Coagulation Profile: Recent Labs  Lab 12/18/19 1928  INR 1.1   Cardiac Enzymes: No results for input(s): CKTOTAL, CKMB, CKMBINDEX, TROPONINI in the last 168 hours. BNP (last 3 results) No results for input(s): PROBNP in the last 8760 hours. HbA1C: No results for input(s): HGBA1C in the last 72 hours. CBG: No results for input(s): GLUCAP in the last 168 hours. Lipid Profile: No results for input(s): CHOL, HDL, LDLCALC, TRIG, CHOLHDL, LDLDIRECT in the last 72 hours. Thyroid Function Tests: No results for input(s): TSH, T4TOTAL, FREET4, T3FREE, THYROIDAB in the last 72 hours. Anemia Panel: No results for input(s): VITAMINB12, FOLATE, FERRITIN, TIBC, IRON, RETICCTPCT in the last 72 hours. Urine analysis:    Component Value Date/Time   COLORURINE STRAW (A) 01/09/2019 1024   APPEARANCEUR CLEAR (A) 01/09/2019 1024   LABSPEC 1.011 01/09/2019 1024   PHURINE 6.0 01/09/2019 1024   GLUCOSEU >=500 (A) 01/09/2019 1024   HGBUR NEGATIVE 01/09/2019 1024   BILIRUBINUR NEGATIVE 01/09/2019 1024   KETONESUR NEGATIVE 01/09/2019 1024   PROTEINUR  NEGATIVE 01/09/2019 1024   NITRITE NEGATIVE 01/09/2019 1024   LEUKOCYTESUR NEGATIVE 01/09/2019 1024   Sepsis Labs: @LABRCNTIP (procalcitonin:4,lacticidven:4) ) Recent Results (from the past 240 hour(s))  Respiratory Panel by RT PCR (Flu A&B, Covid) - Nasopharyngeal Swab  Status: None   Collection Time: 12/18/19  7:12 PM   Specimen: Nasopharyngeal Swab  Result Value Ref Range Status   SARS Coronavirus 2 by RT PCR NEGATIVE NEGATIVE Final    Comment: (NOTE) SARS-CoV-2 target nucleic acids are NOT DETECTED.  The SARS-CoV-2 RNA is generally detectable in upper respiratoy specimens during the acute phase of infection. The lowest concentration of SARS-CoV-2 viral copies this assay can detect is 131 copies/mL. A negative result does not preclude SARS-Cov-2 infection and should not be used as the sole basis for treatment or other patient management decisions. A negative result may occur with  improper specimen collection/handling, submission of specimen other than nasopharyngeal swab, presence of viral mutation(s) within the areas targeted by this assay, and inadequate number of viral copies (<131 copies/mL). A negative result must be combined with clinical observations, patient history, and epidemiological information. The expected result is Negative.  Fact Sheet for Patients:  PinkCheek.be  Fact Sheet for Healthcare Providers:  GravelBags.it  This test is no t yet approved or cleared by the [place] FDA and  has been authorized for detection and/or diagnosis of SARS-CoV-2 by FDA under an Emergency Use Authorization (EUA). This EUA will remain  in effect (meaning this test can be used) for the duration of the COVID-19 declaration under Section 564(b)(1) of the Act, 21 U.S.C. section 360bbb-3(b)(1), unless the authorization is terminated or revoked sooner.     Influenza A by PCR NEGATIVE NEGATIVE Final   Influenza B  by PCR NEGATIVE NEGATIVE Final    Comment: (NOTE) The Xpert Xpress SARS-CoV-2/FLU/RSV assay is intended as an aid in  the diagnosis of influenza from Nasopharyngeal swab specimens and  should not be used as a sole basis for treatment. Nasal washings and  aspirates are unacceptable for Xpert Xpress SARS-CoV-2/FLU/RSV  testing.  Fact Sheet for Patients: PinkCheek.be  Fact Sheet for Healthcare Providers: GravelBags.it  This test is not yet approved or cleared by the [place] FDA and  has been authorized for detection and/or diagnosis of SARS-CoV-2 by  FDA under an Emergency Use Authorization (EUA). This EUA will remain  in effect (meaning this test can be used) for the duration of the  Covid-19 declaration under Section 564(b)(1) of the Act, 21  U.S.C. section 360bbb-3(b)(1), unless the authorization is  terminated or revoked. Performed at [facility], [address] [place], [place] [address]      Radiological Exams on Admission: CT ANGIO CHEST PE W OR WO CONTRAST  Result Date: 12/18/2019 CLINICAL DATA:  Shortness of breath and elevated D-dimer. EXAM: CT ANGIOGRAPHY CHEST WITH CONTRAST TECHNIQUE: Multidetector CT imaging of the chest was performed using the standard protocol during bolus administration of intravenous contrast. Multiplanar CT image reconstructions and MIPs were obtained to evaluate the vascular anatomy. CONTRAST:  7mL OMNIPAQUE IOHEXOL 350 MG/ML SOLN COMPARISON:  Report from radiograph earlier today. Images not available. FINDINGS: Cardiovascular: Positive for acute pulmonary embolus with filling defect involving the distal main pulmonary artery extending into the upper and lower lobar branches. Lesser pulmonary emboli on the right. Segmental filling defect in the right upper lobe, series 4, image 44. There is subsegmental filling defects in the right lower lobe, series 4 images 59 and 71. There is  slight straightening of the intraventricular septum and right heart strain with RV to LV ratio of 1.1. Mild cardiomegaly. Mild aortic atherosclerosis without aortic dissection. Occasional coronary artery calcifications. Trace pericardial effusion. Mediastinum/Nodes: Scattered small mediastinal lymph nodes are not enlarged by  size criteria. There is no hilar adenopathy. Patulous esophagus without wall thickening. No visualized thyroid nodule. Lungs/Pleura: There are scattered nodular densities within the left lower lobe and right middle lobe, all measuring 9-10 mm with indistinct margins and slight surrounding ground-glass. Trace left pleural effusion. Linear atelectasis in the lingula. No findings of pulmonary edema. Upper Abdomen: Nodular hepatic contours suspicious for cirrhosis. No acute upper abdominal findings. Musculoskeletal: There is thin flowing syndesmophytes throughout the thoracic spine with slight spurring of the vertebral bodies typical of ankylosing spondylitis. There is also fusion of the spinous processes. No fracture or acute osseous abnormalities. Review of the MIP images confirms the above findings. IMPRESSION: 1. Positive for acute pulmonary embolus with moderate clot burden and evidence of right heart strain, RV to LV ratio of 1.1. Greatest thromboembolic burden is on the left and involves the distal main left pulmonary artery. 2. Scattered nodular densities within the left lower lobe and right middle lobe, all measuring 9-10 mm with indistinct margins and slight surrounding ground-glass. Findings may be infectious or inflammatory, however recommend follow-up chest CT in 3 months to ensure resolution. Possibility of septic emboli is raised. 3. Trace left pleural effusion. 4. 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 5. Ankylosing spondylitis. Aortic Atherosclerosis (ICD10-I70.0). Critical Value/emergent results were called by telephone at the time of interpretation on  12/18/2019 at 6:14 pm to Dr Dion Body , who verbally acknowledged these results. The patient was asked remain in the CT department by technologists, and referred directly to the emergency room for treatment. Electronically Signed   By: Keith Rake M.D.   On: 12/18/2019 18:15    EKG: Independently reviewed.  Shows sinus tachycardia with evidence of LVH and nonspecific ST changes  Assessment/Plan Principal Problem:   Pulmonary embolism (HCC) Active Problems:   Asthma   Chronic sinusitis   Benign essential hypertension   History of benign prostatic hyperplasia   History of CVA (cerebrovascular accident)   Hypercholesterolemia   Type 2 diabetes mellitus with neurologic complication, without long-term current use of insulin ([place])     #1 bilateral pulmonary embolus with right ventricular strain: Patient will be admitted to a monitored bed.  Initiated on IV heparin.  We will continue with telemetry monitoring oxygen.  Dr. Lucky Cowboy consulted and may plan intervention in the a.m.  The cause of patient's PE is currently unknown.  Could be genetics although no family history.  Work-up once anticoagulation is "at least 3 to 6 months.  #2 diabetes: Sliding scale insulin with home regimen  #3 essential hypertension: Continue home regimen.  #4 history of asthma: No exacerbation.  #5 hyperlipidemia: Confirm on resume home regimen  #6 history of CVA: No residuals.   DVT prophylaxis: Heparin drip Code Status: Full code Family Communication: No family at bedside Disposition Plan: Home Consults called: Dr. Lucky Cowboy Admission status: Inpatient  Severity of Illness: The appropriate patient status for this patient is INPATIENT. Inpatient status is judged to be reasonable and necessary in order to provide the required intensity of service to ensure the patient's safety. The patient's presenting symptoms, physical exam findings, and initial radiographic and laboratory data in the context of their  chronic comorbidities is felt to place them at high risk for further clinical deterioration. Furthermore, it is not anticipated that the patient will be medically stable for discharge from the hospital within 2 midnights of admission. The following factors support the patient status of inpatient.   " The patient's presenting symptoms include shortness  of breath and weakness. " The worrisome physical exam findings include sinus tachycardia. " The initial radiographic and laboratory data are worrisome because of CT findings of large clot PE. " The chronic co-morbidities include hypertension and diabetes.   * I certify that at the point of admission it is my clinical judgment that the patient will require inpatient hospital care spanning beyond 2 midnights from the point of admission due to high intensity of service, high risk for further deterioration and high frequency of surveillance required.Barbette Merino MD [facility] Pager [phone]  If 7PM-7AM, please contact night-coverage [email or web address] Password [email or web address]  12/18/2019, 9:02 PM

## 2019-12-18 NOTE — ED Triage Notes (Signed)
Pt comes pov after having ct scan confirmed a PE today. Dimer was elevated two days ago so PCP sent him here for imaging. Pt denies any pain but states "just don't feel right"

## 2019-12-19 ENCOUNTER — Inpatient Hospital Stay
Admit: 2019-12-19 | Discharge: 2019-12-19 | Disposition: A | Discharge status: Home / Self Care | Payer: Medicare Other | Attending: Internal Medicine | Admitting: Internal Medicine

## 2019-12-19 DIAGNOSIS — E1140 Type 2 diabetes mellitus with diabetic neuropathy, unspecified: Secondary | ICD-10-CM

## 2019-12-19 DIAGNOSIS — I10 Essential (primary) hypertension: Secondary | ICD-10-CM | POA: Diagnosis not present

## 2019-12-19 DIAGNOSIS — Z8673 Personal history of transient ischemic attack (TIA), and cerebral infarction without residual deficits: Secondary | ICD-10-CM | POA: Diagnosis not present

## 2019-12-19 DIAGNOSIS — I2694 Multiple subsegmental pulmonary emboli without acute cor pulmonale: Secondary | ICD-10-CM

## 2019-12-19 DIAGNOSIS — I2609 Other pulmonary embolism with acute cor pulmonale: Principal | ICD-10-CM

## 2019-12-19 LAB — ECHOCARDIOGRAM COMPLETE
AR max vel: 2.5 cm2
AV Area VTI: 2.92 cm2
AV Area mean vel: 2.63 cm2
AV Mean grad: 3.5 mmHg
AV Peak grad: 5.9 mmHg
Ao pk vel: 1.22 m/s
Area-P 1/2: 2.43 cm2
Height: 74 in
Weight: 3824 oz

## 2019-12-19 LAB — HEMOGLOBIN A1C
Hgb A1c MFr Bld: 6.7 % — ABNORMAL HIGH (ref 4.8–5.6)
Mean Plasma Glucose: 145.59 mg/dL

## 2019-12-19 LAB — CBC
HCT: 49.4 % (ref 39.0–52.0)
Hemoglobin: 15.7 g/dL (ref 13.0–17.0)
MCH: 29.5 pg (ref 26.0–34.0)
MCHC: 31.8 g/dL (ref 30.0–36.0)
MCV: 92.7 fL (ref 80.0–100.0)
Platelets: 149 10*3/uL — ABNORMAL LOW (ref 150–400)
RBC: 5.33 MIL/uL (ref 4.22–5.81)
RDW: 13.8 % (ref 11.5–15.5)
WBC: 7.5 10*3/uL (ref 4.0–10.5)
nRBC: 0 % (ref 0.0–0.2)

## 2019-12-19 LAB — COMPREHENSIVE METABOLIC PANEL
ALT: 40 U/L (ref 0–44)
AST: 43 U/L — ABNORMAL HIGH (ref 15–41)
Albumin: 3.5 g/dL (ref 3.5–5.0)
Alkaline Phosphatase: 149 U/L — ABNORMAL HIGH (ref 38–126)
Anion gap: 8 (ref 5–15)
BUN: 16 mg/dL (ref 8–23)
CO2: 25 mmol/L (ref 22–32)
Calcium: 8.6 mg/dL — ABNORMAL LOW (ref 8.9–10.3)
Chloride: 108 mmol/L (ref 98–111)
Creatinine, Ser: 1.2 mg/dL (ref 0.61–1.24)
GFR, Estimated: >60 mL/min (ref >60)
Glucose, Bld: 160 mg/dL — ABNORMAL HIGH (ref 70–99)
Potassium: 3.5 mmol/L (ref 3.5–5.1)
Sodium: 141 mmol/L (ref 135–145)
Total Bilirubin: 0.9 mg/dL (ref 0.3–1.2)
Total Protein: 6.4 g/dL — ABNORMAL LOW (ref 6.5–8.1)

## 2019-12-19 LAB — GLUCOSE, CAPILLARY
Glucose-Capillary: 108 mg/dL — ABNORMAL HIGH (ref 70–99)
Glucose-Capillary: 108 mg/dL — ABNORMAL HIGH (ref 70–99)
Glucose-Capillary: 85 mg/dL (ref 70–99)
Glucose-Capillary: 102 mg/dL — ABNORMAL HIGH (ref 70–99)

## 2019-12-19 LAB — HEPARIN LEVEL (UNFRACTIONATED)
Heparin Unfractionated: 0.91 IU/mL — ABNORMAL HIGH (ref 0.30–0.70)
Heparin Unfractionated: 1.04 IU/mL — ABNORMAL HIGH (ref 0.30–0.70)

## 2019-12-19 LAB — MRSA PCR SCREENING: MRSA by PCR: NEGATIVE

## 2019-12-19 MED ORDER — HYDRALAZINE HCL 25 MG PO TABS
25.0000 mg | ORAL_TABLET | Freq: Three times a day (TID) | ORAL | Status: DC | PRN
  Administered 2019-12-19: 25 mg via ORAL
  Filled 2019-12-19: qty 1

## 2019-12-19 MED ORDER — ATORVASTATIN CALCIUM 20 MG PO TABS
40.0000 mg | ORAL_TABLET | Freq: Every evening | ORAL | Status: DC
  Administered 2019-12-19: 40 mg via ORAL
  Filled 2019-12-19: qty 2

## 2019-12-19 MED ORDER — CALCIUM CARBONATE ANTACID 500 MG PO CHEW
2.0000 | CHEWABLE_TABLET | Freq: Three times a day (TID) | ORAL | Status: DC | PRN
  Administered 2019-12-19: 400 mg via ORAL
  Filled 2019-12-19: qty 2

## 2019-12-19 MED ORDER — MOMETASONE FURO-FORMOTEROL FUM 200-5 MCG/ACT IN AERO
2.0000 | INHALATION_SPRAY | Freq: Two times a day (BID) | RESPIRATORY_TRACT | Status: DC
  Administered 2019-12-19 – 2019-12-20 (×3): 2 via RESPIRATORY_TRACT
  Filled 2019-12-19 (×2): qty 8.8

## 2019-12-19 MED ORDER — HEPARIN (PORCINE) 25000 UT/250ML-% IV SOLN
1300.0000 [IU]/h | INTRAVENOUS | Status: DC
  Administered 2019-12-20: 1300 [IU]/h via INTRAVENOUS
  Filled 2019-12-19: qty 250

## 2019-12-19 MED ORDER — METAXALONE 800 MG PO TABS
800.0000 mg | ORAL_TABLET | Freq: Three times a day (TID) | ORAL | Status: DC
  Administered 2019-12-19 – 2019-12-20 (×4): 800 mg via ORAL
  Filled 2019-12-19 (×7): qty 1

## 2019-12-19 MED ORDER — OXYBUTYNIN CHLORIDE 5 MG PO TABS
5.0000 mg | ORAL_TABLET | Freq: Two times a day (BID) | ORAL | Status: DC
  Administered 2019-12-19 – 2019-12-20 (×3): 5 mg via ORAL
  Filled 2019-12-19 (×5): qty 1

## 2019-12-19 MED ORDER — LORATADINE 10 MG PO TABS
10.0000 mg | ORAL_TABLET | Freq: Every day | ORAL | Status: DC
  Administered 2019-12-19 – 2019-12-20 (×2): 10 mg via ORAL
  Filled 2019-12-19 (×2): qty 1

## 2019-12-19 MED ORDER — CHLORHEXIDINE GLUCONATE CLOTH 2 % EX PADS
6.0000 | MEDICATED_PAD | Freq: Every day | CUTANEOUS | Status: DC
  Administered 2019-12-19: 6 via TOPICAL

## 2019-12-19 MED ORDER — TIZANIDINE HCL 4 MG PO TABS
4.0000 mg | ORAL_TABLET | Freq: Every day | ORAL | Status: DC
  Administered 2019-12-19: 4 mg via ORAL
  Filled 2019-12-19 (×2): qty 1

## 2019-12-19 MED ORDER — TAMSULOSIN HCL 0.4 MG PO CAPS
0.4000 mg | ORAL_CAPSULE | Freq: Every day | ORAL | Status: DC
  Administered 2019-12-19 – 2019-12-20 (×2): 0.4 mg via ORAL
  Filled 2019-12-19 (×2): qty 1

## 2019-12-19 MED ORDER — FLUTICASONE PROPIONATE 50 MCG/ACT NA SUSP
1.0000 | Freq: Two times a day (BID) | NASAL | Status: DC | PRN
  Filled 2019-12-19: qty 16

## 2019-12-19 MED ORDER — LACTASE 3000 UNITS PO TABS
3000.0000 [IU] | ORAL_TABLET | Freq: Every day | ORAL | Status: DC
  Administered 2019-12-19 – 2019-12-20 (×2): 3000 [IU] via ORAL
  Filled 2019-12-19 (×3): qty 1

## 2019-12-19 MED ORDER — AZELASTINE-FLUTICASONE 137-50 MCG/ACT NA SUSP: 1.0000 | Freq: Two times a day (BID) | NASAL | Status: DC | PRN

## 2019-12-19 MED ORDER — MONTELUKAST SODIUM 10 MG PO TABS
10.0000 mg | ORAL_TABLET | Freq: Every day | ORAL | Status: DC
  Administered 2019-12-19: 10 mg via ORAL
  Filled 2019-12-19: qty 1

## 2019-12-19 MED ORDER — ALLOPURINOL 100 MG PO TABS
100.0000 mg | ORAL_TABLET | Freq: Every day | ORAL | Status: DC
  Administered 2019-12-19 – 2019-12-20 (×2): 100 mg via ORAL
  Filled 2019-12-19 (×3): qty 1

## 2019-12-19 MED ORDER — LISINOPRIL 20 MG PO TABS
40.0000 mg | ORAL_TABLET | Freq: Every day | ORAL | Status: DC
  Administered 2019-12-19 – 2019-12-20 (×2): 40 mg via ORAL
  Filled 2019-12-19 (×2): qty 2

## 2019-12-19 MED ORDER — AMLODIPINE BESYLATE 5 MG PO TABS
5.0000 mg | ORAL_TABLET | Freq: Every day | ORAL | Status: DC
  Administered 2019-12-19 – 2019-12-20 (×2): 5 mg via ORAL
  Filled 2019-12-19: qty 1

## 2019-12-19 NOTE — Progress Notes (Signed)
ANTICOAGULATION CONSULT NOTE  Pharmacy Consult for heparin Indication: pulmonary embolus  Allergies  Allergen Reactions   Metformin Diarrhea    Patient Measurements: Height: 6\' 2"  (188 cm) Weight: 108.4 kg (239 lb) IBW/kg (Calculated) : 82.2 Heparin Dosing Weight: 104 kg  Vital Signs: Temp: 98.5 F (36.9 C) (11/04 0430) Temp Source: Oral (11/04 0430) BP: 171/98 (11/04 0400) Pulse Rate: 54 (11/04 0400)  Labs: Recent Labs    12/18/19 1750 12/18/19 1858 12/18/19 1928 12/18/19 2203 12/19/19 0405  HGB  --  16.5  --   --  15.7  HCT  --  52.3*  --   --  49.4  PLT  --  151  --   --  149*  APTT  --   --  33  --   --   LABPROT  --   --  13.8  --   --   INR  --   --  1.1  --   --   HEPARINUNFRC  --   --   --   --  0.91*  CREATININE 1.30* 1.26*  --   --  1.20  TROPONINIHS  --  33*  --  46*  --     Estimated Creatinine Clearance: 71.9 mL/min (by C-G formula based on SCr of 1.2 mg/dL).   Medical History: Past Medical History:  Diagnosis Date   Ankylosing spondylitis (HCC)    Arthritis    knee,hands,back and neck   Asthma    LAST ATTACK 4-5 YRS AGO   BPH (benign prostatic hyperplasia)    CVA (cerebral vascular accident) ([place])    08-20-2018 per patient   Diabetes mellitus without complication ([place])    TYPE 2   Gout    H/O multiple allergies    SEASONAL,ENVIRONMENTAL   H/O sinus surgery    x4   Headache    sinus   Hypertension    Rotator cuff tear    left shoulder   Sleep apnea    C-PAP     Assessment: 74 year old male presented after having imaging done earlier today positive for acute PE with moderate clot burden and evidence of right heart strain. Eliquis listed as PTA med. Called ED RN and discussed with patient. He states he was taken off Eliquis months ago and has not taken it since. Pharmacy consulted for heparin drip.  Goal of Therapy:  Heparin level 0.3-0.7 units/ml Monitor platelets by anticoagulation protocol: Yes   Plan:   Heparin 6000 unit bolus followed by heparin drip at 1700 units/hr. HL tomorrow morning at 0400. CBC daily while on heparin drip.  11/4: HL @ 0405 = 0.91 Will decrease drip rate to 1500 units/hr and recheck HL 8 hrs after rate change.   Jaida Basurto D, PharmD 12/19/2019,6:06 AM

## 2019-12-19 NOTE — Progress Notes (Signed)
*  PRELIMINARY RESULTS* Echocardiogram 2D Echocardiogram has been performed.  Sherrie Sport 12/19/2019, 9:04 AM

## 2019-12-19 NOTE — Hospital Course (Addendum)
Jason Doyle is a 74 y.o. male with medical history significant of diabetes, hypertension, BPH, ankylosing spondylitis, sleep apnea on CPAP, seasonal allergies, history of CVA no longer on Eliquis and gout, presented to the ED on 11/3 after PCP had ordered a CTA chest to evaluate patient's ongoing shortness of breath, weakness and dizziness.  The CTA showed bilateral Pe's with RV strain.    Admitted to hospitalist service with vascular surgery consulted.  Started on heparin infusion.

## 2019-12-19 NOTE — Progress Notes (Signed)
PROGRESS NOTE    Jason Doyle   CVE:[id number]  DOB: 07/20/1945  PCP: Adin Hector, MD    DOA: 12/18/2019 LOS: 1   Brief Narrative   Jason Doyle is a 74 y.o. male with medical history significant of diabetes, hypertension, BPH, ankylosing spondylitis, sleep apnea on CPAP, seasonal allergies, history of CVA no longer on Eliquis and gout, presented to the ED on 11/3 after PCP had ordered a CTA chest to evaluate patient's ongoing shortness of breath, weakness and dizziness.  The CTA showed bilateral Pe's with RV strain.    Admitted to hospitalist service with vascular surgery consulted.  On heparin infusion.     Assessment & Plan   Principal Problem:   Pulmonary embolism (HCC) Active Problems:   Asthma   Chronic sinusitis   Benign essential hypertension   History of benign prostatic hyperplasia   History of CVA (cerebrovascular accident)   Hypercholesterolemia   Type 2 diabetes mellitus with neurologic complication, without long-term current use of insulin ([place])   Acute bilateral PE's -present on admission.  With signs of right heart strain on CTA chest.  Patient has been hemodynamically stable and maintaining O2 sats on room air.     Echo on 11/4 showed normal EF of 60 to 65%, no regional wall motion abnormalities, grade 1 diastolic dysfunction. --Vascular surgery consulted, considering thrombolysis/thrombectomy --N.p.o. after midnight except sips with meds --Transfer to progressive --Continue heparin drip and eventually transition to Eliquis --Telemetry monitoring  History of nonsustained V. Tach -monitor on telemetry.  Maintain K> 4.0 and Mg> 2.0.  Type 2 diabetes -sliding scale NovoLog  Essential hypertension -chronic, stable.  Resumed on home medications.  History of asthma -without exacerbation, stable.  Hyperlipidemia -continue on statin  History of CVA -without residual neurologic deficits.    Gout -continue allopurinol  Ankylosing spondylitis  -continue Skelaxin, Zanaflex at bedtime  Obesity: Body mass index is 30.69 kg/m.  Complicates overall care and prognosis.  DVT prophylaxis: On heparin drip   Diet:  Diet Orders (From admission, onward)    Start     Ordered   12/20/19 0001  Diet NPO time specified Except for: Sips with Meds  Diet effective midnight       Comments: Hold hypoglycemics and diuretic medications  Question:  Except for  Answer:  Sips with Meds   12/19/19 1058   12/18/19 2257  Diet heart healthy/carb modified Room service appropriate? Yes; Fluid consistency: Thin  Diet effective now       Question Answer Comment  Diet-HS Snack? Nothing   Room service appropriate? Yes   Fluid consistency: Thin      12/18/19 2256            Code Status: Full Code    Subjective 12/19/19    Patient seen at bedside today.  He denies any chest pain or shortness of breath.  Reports overall feeling well.  Had some heartburn this morning which resolved partially on its own, but completely after having Tums.  Wife was at bedside and her questions answered.   Disposition Plan & Communication   Status is: Inpatient  Remains inpatient appropriate because:IV treatments appropriate due to intensity of illness or inability to take PO.  Patient remains on heparin infusion for acute bilateral Pes.   Dispo: The patient is from: Home              Anticipated d/c is to: Home  Anticipated d/c date is: 2 days              Patient currently is not medically stable to d/c.     Family Communication: wife at bedside during encounter.    Consults, Procedures, Significant Events   Consultants:   Vascular surgery  Procedures:   none  Antimicrobials:  Anti-infectives (From admission, onward)   None        Objective   Vitals:   12/19/19 1100 12/19/19 1200 12/19/19 1300 12/19/19 1400  BP: (!) 153/94 (!) 149/88 (!) 145/96 (!) 153/79  Pulse: 69 (!) 57 63 64  Resp: 19 (!) 23 (!) 22 (!) 21  Temp:  (!) 97.5  F (36.4 C)    TempSrc:  Axillary    SpO2: 98% 98% 95% 98%  Weight:      Height:        Intake/Output Summary (Last 24 hours) at 12/19/2019 1514 Last data filed at 12/19/2019 1400 Gross per 24 hour  Intake 909.73 ml  Output 1975 ml  Net -1065.27 ml   Filed Weights   12/18/19 1845  Weight: 108.4 kg    Physical Exam:  General exam: awake, alert, no acute distress HEENT: atraumatic, clear conjunctiva, anicteric sclera, moist mucus membranes, hearing grossly normal  Respiratory system: CTAB, no wheezes, rales or rhonchi, normal respiratory effort. Cardiovascular system: normal S1/S2, RRR, no JVD, murmurs, rubs, gallops, no pedal edema.   Gastrointestinal system: soft, NT, ND, +bowel sounds. Central nervous system: A&O x4. no gross focal neurologic deficits, normal speech Extremities: moves all, no edema, normal tone Psychiatry: normal mood, congruent affect, judgement and insight appear normal  Labs   Data Reviewed: I have personally reviewed following labs and imaging studies  CBC: Recent Labs  Lab 12/18/19 1858 12/19/19 0405  WBC 7.8 7.5  HGB 16.5 15.7  HCT 52.3* 49.4  MCV 92.4 92.7  PLT 151 270*   Basic Metabolic Panel: Recent Labs  Lab 12/18/19 1750 12/18/19 1858 12/19/19 0405  NA  --  142 141  K  --  4.4 3.5  CL  --  105 108  CO2  --  26 25  GLUCOSE  --  116* 160*  BUN  --  18 16  CREATININE 1.30* 1.26* 1.20  CALCIUM  --  8.6* 8.6*   GFR: Estimated Creatinine Clearance: 71.9 mL/min (by C-G formula based on SCr of 1.2 mg/dL). Liver Function Tests: Recent Labs  Lab 12/18/19 1858 12/19/19 0405  AST 55* 43*  ALT 49* 40  ALKPHOS 180* 149*  BILITOT 1.3* 0.9  PROT 7.5 6.4*  ALBUMIN 4.0 3.5   No results for input(s): LIPASE, AMYLASE in the last 168 hours. No results for input(s): AMMONIA in the last 168 hours. Coagulation Profile: Recent Labs  Lab 12/18/19 1928  INR 1.1   Cardiac Enzymes: No results for input(s): CKTOTAL, CKMB, CKMBINDEX,  TROPONINI in the last 168 hours. BNP (last 3 results) No results for input(s): PROBNP in the last 8760 hours. HbA1C: Recent Labs    12/19/19 0405  HGBA1C 6.7*   CBG: Recent Labs  Lab 12/18/19 2352 12/19/19 1111  GLUCAP 104* 108*   Lipid Profile: No results for input(s): CHOL, HDL, LDLCALC, TRIG, CHOLHDL, LDLDIRECT in the last 72 hours. Thyroid Function Tests: No results for input(s): TSH, T4TOTAL, FREET4, T3FREE, THYROIDAB in the last 72 hours. Anemia Panel: No results for input(s): VITAMINB12, FOLATE, FERRITIN, TIBC, IRON, RETICCTPCT in the last 72 hours. Sepsis Labs: No results for input(s): PROCALCITON,  LATICACIDVEN in the last 168 hours.  Recent Results (from the past 240 hour(s))  Respiratory Panel by RT PCR (Flu A&B, Covid) - Nasopharyngeal Swab     Status: None   Collection Time: 12/18/19  7:12 PM   Specimen: Nasopharyngeal Swab  Result Value Ref Range Status   SARS Coronavirus 2 by RT PCR NEGATIVE NEGATIVE Final    Comment: (NOTE) SARS-CoV-2 target nucleic acids are NOT DETECTED.  The SARS-CoV-2 RNA is generally detectable in upper respiratoy specimens during the acute phase of infection. The lowest concentration of SARS-CoV-2 viral copies this assay can detect is 131 copies/mL. A negative result does not preclude SARS-Cov-2 infection and should not be used as the sole basis for treatment or other patient management decisions. A negative result may occur with  improper specimen collection/handling, submission of specimen other than nasopharyngeal swab, presence of viral mutation(s) within the areas targeted by this assay, and inadequate number of viral copies (<131 copies/mL). A negative result must be combined with clinical observations, patient history, and epidemiological information. The expected result is Negative.  Fact Sheet for Patients:  PinkCheek.be  Fact Sheet for Healthcare Providers:   GravelBags.it  This test is no t yet approved or cleared by the [place] FDA and  has been authorized for detection and/or diagnosis of SARS-CoV-2 by FDA under an Emergency Use Authorization (EUA). This EUA will remain  in effect (meaning this test can be used) for the duration of the COVID-19 declaration under Section 564(b)(1) of the Act, 21 U.S.C. section 360bbb-3(b)(1), unless the authorization is terminated or revoked sooner.     Influenza A by PCR NEGATIVE NEGATIVE Final   Influenza B by PCR NEGATIVE NEGATIVE Final    Comment: (NOTE) The Xpert Xpress SARS-CoV-2/FLU/RSV assay is intended as an aid in  the diagnosis of influenza from Nasopharyngeal swab specimens and  should not be used as a sole basis for treatment. Nasal washings and  aspirates are unacceptable for Xpert Xpress SARS-CoV-2/FLU/RSV  testing.  Fact Sheet for Patients: PinkCheek.be  Fact Sheet for Healthcare Providers: GravelBags.it  This test is not yet approved or cleared by the [place] FDA and  has been authorized for detection and/or diagnosis of SARS-CoV-2 by  FDA under an Emergency Use Authorization (EUA). This EUA will remain  in effect (meaning this test can be used) for the duration of the  Covid-19 declaration under Section 564(b)(1) of the Act, 21  U.S.C. section 360bbb-3(b)(1), unless the authorization is  terminated or revoked. Performed at [facility], [address] [place], [place] [address]   MRSA PCR Screening     Status: None   Collection Time: 12/19/19  4:29 AM   Specimen: Nasopharyngeal  Result Value Ref Range Status   MRSA by PCR NEGATIVE NEGATIVE Final    Comment:        The GeneXpert MRSA Assay (FDA approved for NASAL specimens only), is one component of a comprehensive MRSA colonization surveillance program. It is not intended to diagnose MRSA infection nor to guide  or monitor treatment for MRSA infections. Performed at [facility], [address] [place],  [address]       Imaging Studies   CT ANGIO CHEST PE W OR WO CONTRAST  Result Date: 12/18/2019 CLINICAL DATA:  Shortness of breath and elevated D-dimer. EXAM: CT ANGIOGRAPHY CHEST WITH CONTRAST TECHNIQUE: Multidetector CT imaging of the chest was performed using the standard protocol during bolus administration of intravenous contrast. Multiplanar CT image reconstructions and  MIPs were obtained to evaluate the vascular anatomy. CONTRAST:  36mL OMNIPAQUE IOHEXOL 350 MG/ML SOLN COMPARISON:  Report from radiograph earlier today. Images not available. FINDINGS: Cardiovascular: Positive for acute pulmonary embolus with filling defect involving the distal main pulmonary artery extending into the upper and lower lobar branches. Lesser pulmonary emboli on the right. Segmental filling defect in the right upper lobe, series 4, image 44. There is subsegmental filling defects in the right lower lobe, series 4 images 59 and 71. There is slight straightening of the intraventricular septum and right heart strain with RV to LV ratio of 1.1. Mild cardiomegaly. Mild aortic atherosclerosis without aortic dissection. Occasional coronary artery calcifications. Trace pericardial effusion. Mediastinum/Nodes: Scattered small mediastinal lymph nodes are not enlarged by size criteria. There is no hilar adenopathy. Patulous esophagus without wall thickening. No visualized thyroid nodule. Lungs/Pleura: There are scattered nodular densities within the left lower lobe and right middle lobe, all measuring 9-10 mm with indistinct margins and slight surrounding ground-glass. Trace left pleural effusion. Linear atelectasis in the lingula. No findings of pulmonary edema. Upper Abdomen: Nodular hepatic contours suspicious for cirrhosis. No acute upper abdominal findings. Musculoskeletal: There is thin flowing syndesmophytes  throughout the thoracic spine with slight spurring of the vertebral bodies typical of ankylosing spondylitis. There is also fusion of the spinous processes. No fracture or acute osseous abnormalities. Review of the MIP images confirms the above findings. IMPRESSION: 1. Positive for acute pulmonary embolus with moderate clot burden and evidence of right heart strain, RV to LV ratio of 1.1. Greatest thromboembolic burden is on the left and involves the distal main left pulmonary artery. 2. Scattered nodular densities within the left lower lobe and right middle lobe, all measuring 9-10 mm with indistinct margins and slight surrounding ground-glass. Findings may be infectious or inflammatory, however recommend follow-up chest CT in 3 months to ensure resolution. Possibility of septic emboli is raised. 3. Trace left pleural effusion. 4. 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 5. Ankylosing spondylitis. Aortic Atherosclerosis (ICD10-I70.0). Critical Value/emergent results were called by telephone at the time of interpretation on 12/18/2019 at 6:14 pm to Dr Dion Body , who verbally acknowledged these results. The patient was asked remain in the CT department by technologists, and referred directly to the emergency room for treatment. Electronically Signed   By: Keith Rake M.D.   On: 12/18/2019 18:15   ECHOCARDIOGRAM COMPLETE  Result Date: 12/19/2019    ECHOCARDIOGRAM REPORT   Patient Name:   Jason Doyle Date of Exam: 12/19/2019 Medical Rec #:  [id number]      Height:       74.0 in Accession #:    [id number]     Weight:       239.0 lb Date of Birth:  [birth year]      BSA:          2.344 m Patient Age:    4 years       BP:           171/98 mmHg Patient Gender: M              HR:           54 bpm. Exam Location:  [facility] Procedure: 2D Echo, Cardiac Doppler and Color Doppler Indications:     Pulmonary Embolus 415.19  History:         Patient has prior history of Echocardiogram  examinations, most  recent 08/21/2018. Risk Factors:Hypertension and Diabetes.  Sonographer:     Sherrie Sport RDCS (AE) Referring Phys:  [place] Diagnosing Phys: Isaias Cowman MD IMPRESSIONS  1. Left ventricular ejection fraction, by estimation, is 60 to 65%. The left ventricle has normal function. The left ventricle has no regional wall motion abnormalities. Left ventricular diastolic parameters are consistent with Grade I diastolic dysfunction (impaired relaxation).  2. Right ventricular systolic function is normal. The right ventricular size is normal.  3. The mitral valve is normal in structure. Trivial mitral valve regurgitation. No evidence of mitral stenosis.  4. The aortic valve is normal in structure. Aortic valve regurgitation is not visualized. No aortic stenosis is present.  5. The inferior vena cava is normal in size with greater than 50% respiratory variability, suggesting right atrial pressure of 3 mmHg. FINDINGS  Left Ventricle: Left ventricular ejection fraction, by estimation, is 60 to 65%. The left ventricle has normal function. The left ventricle has no regional wall motion abnormalities. The left ventricular internal cavity size was normal in size. There is  no left ventricular hypertrophy. Left ventricular diastolic parameters are consistent with Grade I diastolic dysfunction (impaired relaxation). Right Ventricle: The right ventricular size is normal. No increase in right ventricular wall thickness. Right ventricular systolic function is normal. Left Atrium: Left atrial size was normal in size. Right Atrium: Right atrial size was normal in size. Pericardium: There is no evidence of pericardial effusion. Mitral Valve: The mitral valve is normal in structure. Trivial mitral valve regurgitation. No evidence of mitral valve stenosis. Tricuspid Valve: The tricuspid valve is normal in structure. Tricuspid valve regurgitation is trivial. No evidence of tricuspid  stenosis. Aortic Valve: The aortic valve is normal in structure. Aortic valve regurgitation is not visualized. No aortic stenosis is present. Aortic valve mean gradient measures 3.5 mmHg. Aortic valve peak gradient measures 5.9 mmHg. Aortic valve area, by VTI measures 2.92 cm. Pulmonic Valve: The pulmonic valve was normal in structure. Pulmonic valve regurgitation is not visualized. No evidence of pulmonic stenosis. Aorta: The aortic root is normal in size and structure. Venous: The inferior vena cava is normal in size with greater than 50% respiratory variability, suggesting right atrial pressure of 3 mmHg. IAS/Shunts: No atrial level shunt detected by color flow Doppler.  LEFT VENTRICLE PLAX 2D LVIDd:         4.68 cm  Diastology LV PW:         1.05 cm  LV e' medial:    4.68 cm/s LV IVS:        1.06 cm  LV E/e' medial:  12.7 LVOT diam:     2.00 cm  LV e' lateral:   6.09 cm/s LV SV:         72       LV E/e' lateral: 9.8 LV SV Index:   31 LVOT Area:     3.14 cm  RIGHT VENTRICLE RV Basal diam:  2.96 cm RV S prime:     13.70 cm/s TAPSE (M-mode): 3.9 cm LEFT ATRIUM             Index       RIGHT ATRIUM           Index LA diam:        3.30 cm 1.41 cm/m  RA Area:     20.10 cm LA Vol (A2C):   66.3 ml 28.28 ml/m RA Volume:   53.70 ml  22.91 ml/m LA Vol (A4C):  54.8 ml 23.37 ml/m LA Biplane Vol: 60.2 ml 25.68 ml/m  AORTIC VALVE                   PULMONIC VALVE AV Area (Vmax):    2.50 cm    PV Vmax:        0.92 m/s AV Area (Vmean):   2.63 cm    PV Peak grad:   3.4 mmHg AV Area (VTI):     2.92 cm    RVOT Peak grad: 4 mmHg AV Vmax:           121.50 cm/s AV Vmean:          86.600 cm/s AV VTI:            0.246 m AV Peak Grad:      5.9 mmHg AV Mean Grad:      3.5 mmHg LVOT Vmax:         96.60 cm/s LVOT Vmean:        72.600 cm/s LVOT VTI:          0.229 m LVOT/AV VTI ratio: 0.93 MITRAL VALVE                TRICUSPID VALVE MV Area (PHT): 2.43 cm     TR Peak grad:   15.1 mmHg MV Decel Time: 312 msec     TR Vmax:         194.00 cm/s MV E velocity: 59.60 cm/s MV A velocity: 101.00 cm/s  SHUNTS MV E/A ratio:  0.59         Systemic VTI:  0.23 m                             Systemic Diam: 2.00 cm Isaias Cowman MD Electronically signed by Isaias Cowman MD Signature Date/Time: 12/19/2019/12:51:48 PM    Final      Medications   Scheduled Meds: . allopurinol  100 mg Oral Daily  . amLODipine  5 mg Oral Q0600  . atorvastatin  40 mg Oral QPM  . Chlorhexidine Gluconate Cloth  6 each Topical Daily  . insulin aspart  0-15 Units Subcutaneous TID WC  . insulin aspart  0-5 Units Subcutaneous QHS  . lactase  3,000 Units Oral Q0600  . lisinopril  40 mg Oral Daily  . loratadine  10 mg Oral Daily  . metaxalone  800 mg Oral TID  . mometasone-formoterol  2 puff Inhalation BID  . montelukast  10 mg Oral QHS  . oxybutynin  5 mg Oral BID  . tamsulosin  0.4 mg Oral QPC breakfast  . tiZANidine  4 mg Oral QHS   Continuous Infusions: . sodium chloride 100 mL/hr at 12/19/19 0814  . heparin 1,500 Units/hr (12/19/19 0816)       LOS: 1 day    Time spent: 30 minutes    Ezekiel Slocumb, DO [facility]  12/19/2019, 3:14 PM    If 7PM-7AM, please contact night-coverage. How to contact the [facility] Attending or Consulting provider [place] or covering provider during after hours [place], for this patient?    1. Check the care team in [facility] and look for a) attending/consulting [facility] provider listed and b) the [facility] team listed 2. Log into [email or web address] and use [place]'s universal password to access. If you do not have the password, please contact the hospital operator. 3. Locate the [facility] provider you are looking for under [facility] and page to a number that you can be directly reached. 4. If you still have difficulty reaching the provider, please page the [facility] (Director on Call) for the Hospitalists listed on amion for assistance.

## 2019-12-19 NOTE — Progress Notes (Signed)
ANTICOAGULATION CONSULT NOTE  Pharmacy Consult for heparin Indication: pulmonary embolus  Patient Measurements: Height: 6\' 2"  (188 cm) Weight: 108.4 kg (239 lb) IBW/kg (Calculated) : 82.2 Heparin Dosing Weight: 104 kg  Labs: Recent Labs    12/18/19 1750 12/18/19 1858 12/18/19 1928 12/18/19 2203 12/19/19 0405 12/19/19 1420  HGB  --  16.5  --   --  15.7  --   HCT  --  52.3*  --   --  49.4  --   PLT  --  151  --   --  149*  --   APTT  --   --  33  --   --   --   LABPROT  --   --  13.8  --   --   --   INR  --   --  1.1  --   --   --   HEPARINUNFRC  --   --   --   --  0.91* 1.04*  CREATININE 1.30* 1.26*  --   --  1.20  --   TROPONINIHS  --  33*  --  46*  --   --     Estimated Creatinine Clearance: 71.9 mL/min (by C-G formula based on SCr of 1.2 mg/dL).   Medical History: Past Medical History:  Diagnosis Date  . Ankylosing spondylitis ([place])   . Arthritis    knee,hands,back and neck  . Asthma    LAST ATTACK 4-5 YRS AGO  . BPH (benign prostatic hyperplasia)   . CVA (cerebral vascular accident) ([place])    08-20-2018 per patient  . Diabetes mellitus without complication ([place])    TYPE 2  . Gout   . H/O multiple allergies    SEASONAL,ENVIRONMENTAL  . H/O sinus surgery    x4  . Headache    sinus  . Hypertension   . Rotator cuff tear    left shoulder  . Sleep apnea    C-PAP    Assessment: Patient is a 74 year old male presented after having imaging done earlier today positive for acute PE with moderate clot burden and evidence of right heart strain. Patient previously on apixaban after a stroke but no longer actively on anticoagulation. Pharmacy consulted for heparin drip for PE treatment.   Goal of Therapy:  Heparin level 0.3-0.7 units/ml Monitor platelets by anticoagulation protocol: Yes   Plan:  --11/4 at 1420 HL = 1.04, supratherapeutic. Will hold heparin infusion x 1 hour and then re-start drip at decreased rate of 1300 units/hr --HL 8 hours after rate  change --Daily CBC per protocol  Benita Gutter 12/19/2019,3:23 PM

## 2019-12-19 NOTE — ED Notes (Signed)
Pt given sandwich tray at this time with diet ginger ale per diet order. Pt comfortable in bed, urinal emptied and pillow given. No further needs noted, will continue to monitor.

## 2019-12-19 NOTE — Consult Note (Signed)
[facility] Vascular Consult Note  MRN : [id number]  Jason Doyle is a 74 y.o. (1945-03-07) male who presents with chief complaint of  Chief Complaint  Patient presents with  . confirmed blood clot   History of Present Illness:  Jason Doyle is a 74 year old male with medical history significant of diabetes, hypertension, BPH, ankylosing spondylitis, sleep apnea on CPAP, seasonal allergies, history of CVA and gout who was previously on Eliquis after his stroke. Patient has been off anticoagulation.  He apparently has been very active until recently when he started having drop in his blood pressure with activities. This started about a week ago.  He therefore decrease his physical activities. He was walking as far as three [place] prior to that.  He continues to feel weak and dizzy. The dizziness has been on and off but has become a little more persistent in the last 2 days. He went to see his PCP today where work-up was done including CT angiogram of the chest that showed significant PE.  Patient therefore sent to the ER for evaluation and treatment. He denied any chest pain.  He denied any family history of blood clots. Denied any recent surgery. Patient is seen and evaluated at not being admitted to the hospital with acute bilateral pulmonary embolus with labs clot burden and right ventricular strain.   Vascular surgery was consulted by Dr. Tamala Julian for possible pulmonary intervention  Current Facility-Administered Medications  Medication Dose Route Frequency Provider Last Rate Last Admin  . 0.9 %  sodium chloride infusion   Intravenous Continuous Elwyn Reach, MD 100 mL/hr at 12/19/19 0814 New Bag at 12/19/19 0814  . acetaminophen (TYLENOL) tablet 650 mg  650 mg Oral Q6H PRN Elwyn Reach, MD   650 mg at 12/19/19 0020   Or  . acetaminophen (TYLENOL) suppository 650 mg  650 mg Rectal Q6H PRN Elwyn Reach, MD      . allopurinol (ZYLOPRIM) tablet 100 mg  100  mg Oral Daily Nicole Kindred A, DO   100 mg at 12/19/19 0941  . amLODipine (NORVASC) tablet 5 mg  5 mg Oral Q0600 Nicole Kindred A, DO   5 mg at 12/19/19 1013  . atorvastatin (LIPITOR) tablet 40 mg  40 mg Oral QPM Nicole Kindred A, DO      . calcium carbonate (TUMS - dosed in mg elemental calcium) chewable tablet 400 mg of elemental calcium  2 tablet Oral TID PRN Nicole Kindred A, DO   400 mg of elemental calcium at 12/19/19 0948  . Chlorhexidine Gluconate Cloth 2 % PADS 6 each  6 each Topical Daily Jonelle Sidle, Mohammad L, MD      . fluticasone (FLONASE) 50 MCG/ACT nasal spray 1 spray  1 spray Each Nare BID PRN Nicole Kindred A, DO      . heparin ADULT infusion 100 units/mL (25000 units/259mL sodium chloride 0.45%)  1,500 Units/hr Intravenous Continuous Elwyn Reach, MD 15 mL/hr at 12/19/19 0816 1,500 Units/hr at 12/19/19 0816  . hydrALAZINE (APRESOLINE) tablet 25 mg  25 mg Oral Q8H PRN Nicole Kindred A, DO      . HYDROmorphone (DILAUDID) injection 0.5-1 mg  0.5-1 mg Intravenous Q2H PRN Garba, Mohammad L, MD      . insulin aspart (novoLOG) injection 0-15 Units  0-15 Units Subcutaneous TID WC Garba, Mohammad L, MD      . insulin aspart (novoLOG) injection 0-5 Units  0-5 Units Subcutaneous QHS Garba, Mohammad L,  MD      . lactase (LACTAID) tablet 3,000 Units  3,000 Units Oral Q0600 Nicole Kindred A, DO   3,000 Units at 12/19/19 1013  . lisinopril (ZESTRIL) tablet 40 mg  40 mg Oral Daily Nicole Kindred A, DO   40 mg at 12/19/19 0946  . loratadine (CLARITIN) tablet 10 mg  10 mg Oral Daily Nicole Kindred A, DO   10 mg at 12/19/19 0946  . metaxalone (SKELAXIN) tablet 800 mg  800 mg Oral TID Nicole Kindred A, DO   800 mg at 12/19/19 0941  . mometasone-formoterol (DULERA) 200-5 MCG/ACT inhaler 2 puff  2 puff Inhalation BID Nicole Kindred A, DO   2 puff at 12/19/19 0941  . montelukast (SINGULAIR) tablet 10 mg  10 mg Oral QHS Nicole Kindred A, DO      . ondansetron (ZOFRAN) tablet 4 mg  4 mg Oral  Q6H PRN Elwyn Reach, MD       Or  . ondansetron (ZOFRAN) injection 4 mg  4 mg Intravenous Q6H PRN Elwyn Reach, MD      . oxybutynin (DITROPAN) tablet 5 mg  5 mg Oral BID Nicole Kindred A, DO   5 mg at 12/19/19 0941  . tamsulosin (FLOMAX) capsule 0.4 mg  0.4 mg Oral QPC breakfast Nicole Kindred A, DO   0.4 mg at 12/19/19 0946  . tiZANidine (ZANAFLEX) tablet 4 mg  4 mg Oral QHS Ezekiel Slocumb, DO       Past Medical History:  Diagnosis Date  . Ankylosing spondylitis ([place])   . Arthritis    knee,hands,back and neck  . Asthma    LAST ATTACK 4-5 YRS AGO  . BPH (benign prostatic hyperplasia)   . CVA (cerebral vascular accident) ([place])    08-20-2018 per patient  . Diabetes mellitus without complication ([place])    TYPE 2  . Gout   . H/O multiple allergies    SEASONAL,ENVIRONMENTAL  . H/O sinus surgery    x4  . Headache    sinus  . Hypertension   . Rotator cuff tear    left shoulder  . Sleep apnea    C-PAP   Past Surgical History:  Procedure Laterality Date  . BICEPT TENODESIS Left 07/23/2014   Procedure: BICEPS TENODESIS;  Surgeon: [name], MD;  Location: [facility];  Service: Orthopedics;  Laterality: Left;  . COLONOSCOPY WITH PROPOFOL N/A 04/10/2017   Procedure: COLONOSCOPY WITH PROPOFOL;  Surgeon: [name], MD;  Location[facility] END[facility]COPY;  Service: Endoscopy;  Laterality: N/A;  . KNEE ARTHROSCOPY    . LOOP RECORDER INSERTION N/A 11/20/2018   Procedure: LOOP RECORDER INSERTION;  Surgeon: Isaias Cowman, MD;  Location: [facility];  Service: Cardiovascular;  Laterality: N/A;  . NASAL SINUS SURGERY     x 4  . SHOULDER ARTHROSCOPY WITH ROTATOR CUFF REPAIR AND SUBACROMIAL DECOMPRESSION Left 07/23/2014   Procedure: SHOULDER ARTHROSCOPY WITH ROTATOR CUFF REPAIR AND SUBACROMIAL DECOMPRESSION;  Surgeon: [name], MD;  Location: [place];  Service: Orthopedics;  Laterality: Left;  . UVULOPALATOPHARYNGOPLASTY     Social  History Social History   Tobacco Use  . Smoking status: Never Smoker  . Smokeless tobacco: Never Used  Vaping Use  . Vaping Use: Never used  Substance Use Topics  . Alcohol use: Not Currently    Comment: RARELY "1/2 glass wine a year"  . Drug use: Not Currently   Family History History reviewed. No pertinent family history.  Denies family history of peripheral heart disease, venous disease or renal disease.  Allergies  Allergen Reactions  . Metformin Diarrhea   REVIEW OF SYSTEMS (Negative unless checked)  Constitutional: [] Weight loss  [] Fever  [] Chills Cardiac: [] Chest pain   [x] Chest pressure   [] Palpitations   [] Shortness of breath when laying flat   [] Shortness of breath at rest   [x] Shortness of breath with exertion. Vascular:  [] Pain in legs with walking   [] Pain in legs at rest   [] Pain in legs when laying flat   [] Claudication   [] Pain in feet when walking  [] Pain in feet at rest  [] Pain in feet when laying flat   [] History of DVT   [] Phlebitis   [] Swelling in legs   [] Varicose veins   [] Non-healing ulcers Pulmonary:   [] Uses home oxygen   [] Productive cough   [] Hemoptysis   [] Wheeze  [] COPD   [] Asthma Neurologic:  [] Dizziness  [] Blackouts   [] Seizures   [x] History of stroke   [] History of TIA  [] Aphasia   [] Temporary blindness   [] Dysphagia   [] Weakness or numbness in arms   [] Weakness or numbness in legs Musculoskeletal:  [] Arthritis   [] Joint swelling   [] Joint pain   [] Low back pain Hematologic:  [] Easy bruising  [] Easy bleeding   [] Hypercoagulable state   [] Anemic  [] Hepatitis Gastrointestinal:  [] Blood in stool   [] Vomiting blood  [] Gastroesophageal reflux/heartburn   [] Difficulty swallowing. Genitourinary:  [] Chronic kidney disease   [] Difficult urination  [] Frequent urination  [] Burning with urination   [] Blood in urine Skin:  [] Rashes   [] Ulcers   [] Wounds Psychological:  [] History of anxiety   []  History of major depression.   Physical Examination  Vitals:    12/19/19 1100 12/19/19 1200 12/19/19 1300 12/19/19 1400  BP: (!) 153/94 (!) 149/88 (!) 145/96 (!) 153/79  Pulse: 69 (!) 57 63 64  Resp: 19 (!) 23 (!) 22 (!) 21  Temp:  (!) 97.5 F (36.4 C)    TempSrc:  Axillary    SpO2: 98% 98% 95% 98%  Weight:      Height:       Body mass index is 30.69 kg/m. Gen:  WD/WN, NAD Head: [place]/AT, No temporalis wasting. Prominent temp pulse not noted. Ear/Nose/Throat: Hearing grossly intact, nares w/o erythema or drainage, oropharynx w/o Erythema/Exudate Eyes: Sclera non-icteric, conjunctiva clear Neck: Trachea midline.  No JVD.  Pulmonary:  Good air movement, respirations not labored, equal bilaterally.  Cardiac: RRR, normal S1, S2. Vascular:  Vessel Right Left  Radial Palpable Palpable  Ulnar Palpable Palpable  Brachial Palpable Palpable  Carotid Palpable, without bruit Palpable, without bruit  Aorta Not palpable N/A  Femoral Palpable Palpable  Popliteal Palpable Palpable  PT Palpable Palpable  DP Palpable Palpable   Gastrointestinal: soft, non-tender/non-distended. No guarding/reflex.  Musculoskeletal: M/S 5/5 throughout.  Extremities without ischemic changes.  No deformity or atrophy. No edema. Neurologic: Sensation grossly intact in extremities.  Symmetrical.  Speech is fluent. Motor exam as listed above. Psychiatric: Judgment intact, Mood & affect appropriate for pt's clinical situation. Dermatologic: No rashes or ulcers noted.  No cellulitis or open wounds. Lymph : No Cervical, Axillary, or Inguinal lymphadenopathy.  CBC Lab Results  Component Value Date   WBC 7.5 12/19/2019   HGB 15.7 12/19/2019   HCT 49.4 12/19/2019   MCV 92.7 12/19/2019   PLT 149 (L) 12/19/2019   BMET    Component Value Date/Time   NA 141 12/19/2019 0405   K 3.5 12/19/2019 0405  CL 108 12/19/2019 0405   CO2 25 12/19/2019 0405   GLUCOSE 160 (H) 12/19/2019 0405   BUN 16 12/19/2019 0405   CREATININE 1.20 12/19/2019 0405   CALCIUM 8.6 (L) 12/19/2019 0405    GFRNONAA >60 12/19/2019 0405   GFRAA >60 04/27/2019 0435   Estimated Creatinine Clearance: 71.9 mL/min (by C-G formula based on SCr of 1.2 mg/dL).  COAG Lab Results  Component Value Date   INR 1.1 12/18/2019   INR 1.1 08/20/2018   Radiology CT Head Wo Contrast  Result Date: 12/10/2019 CLINICAL DATA:  Acute onset dizziness and nausea EXAM: CT HEAD WITHOUT CONTRAST TECHNIQUE: Contiguous axial images were obtained from the base of the skull through the vertex without intravenous contrast. COMPARISON:  CT 01/09/2019, MRI 09/05/2018 FINDINGS: Brain: Redemonstration of a region of encephalomalacia in the right parietal lobe, unchanged from comparison imaging. No evidence of acute infarction, hemorrhage, hydrocephalus, extra-axial collection, visible mass lesion or mass effect. Symmetric prominence of the ventricles, cisterns and sulci compatible with parenchymal volume loss. Patchy areas of white matter hypoattenuation are most compatible with chronic microvascular angiopathy. Vascular: Atherosclerotic calcification of the carotid siphons and intradural vertebral arteries. No hyperdense vessel. Skull: No calvarial fracture or suspicious osseous lesion. No scalp swelling or hematoma. Sinuses/Orbits: Paranasal sinuses and mastoid air cells are predominantly clear with evidence of prior sinus surgery including bilateral ethmoidectomy and maxillary antrostomy. Included orbital structures are unremarkable. Other: None. IMPRESSION: 1. No acute intracranial abnormality. 2. 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 3. 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 4. Prior sinus surgery. Electronically Signed   By: Lovena Le M.D.   On: 12/10/2019 20:05   CT ANGIO CHEST PE W OR WO CONTRAST  Result Date: 12/18/2019 CLINICAL DATA:  Shortness of breath and elevated D-dimer. EXAM: CT ANGIOGRAPHY CHEST WITH CONTRAST TECHNIQUE: Multidetector CT imaging of the chest was  performed using the standard protocol during bolus administration of intravenous contrast. Multiplanar CT image reconstructions and MIPs were obtained to evaluate the vascular anatomy. CONTRAST:  23mL OMNIPAQUE IOHEXOL 350 MG/ML SOLN COMPARISON:  Report from radiograph earlier today. Images not available. FINDINGS: Cardiovascular: Positive for acute pulmonary embolus with filling defect involving the distal main pulmonary artery extending into the upper and lower lobar branches. Lesser pulmonary emboli on the right. Segmental filling defect in the right upper lobe, series 4, image 44. There is subsegmental filling defects in the right lower lobe, series 4 images 59 and 71. There is slight straightening of the intraventricular septum and right heart strain with RV to LV ratio of 1.1. Mild cardiomegaly. Mild aortic atherosclerosis without aortic dissection. Occasional coronary artery calcifications. Trace pericardial effusion. Mediastinum/Nodes: Scattered small mediastinal lymph nodes are not enlarged by size criteria. There is no hilar adenopathy. Patulous esophagus without wall thickening. No visualized thyroid nodule. Lungs/Pleura: There are scattered nodular densities within the left lower lobe and right middle lobe, all measuring 9-10 mm with indistinct margins and slight surrounding ground-glass. Trace left pleural effusion. Linear atelectasis in the lingula. No findings of pulmonary edema. Upper Abdomen: Nodular hepatic contours suspicious for cirrhosis. No acute upper abdominal findings. Musculoskeletal: There is thin flowing syndesmophytes throughout the thoracic spine with slight spurring of the vertebral bodies typical of ankylosing spondylitis. There is also fusion of the spinous processes. No fracture or acute osseous abnormalities. Review of the MIP images confirms the above findings. IMPRESSION: 1. Positive for acute pulmonary embolus with moderate clot burden and evidence of right heart strain,  RV to  LV ratio of 1.1. Greatest thromboembolic burden is on the left and involves the distal main left pulmonary artery. 2. Scattered nodular densities within the left lower lobe and right middle lobe, all measuring 9-10 mm with indistinct margins and slight surrounding ground-glass. Findings may be infectious or inflammatory, however recommend follow-up chest CT in 3 months to ensure resolution. Possibility of septic emboli is raised. 3. Trace left pleural effusion. 4. 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 5. Ankylosing spondylitis. Aortic Atherosclerosis (ICD10-I70.0). Critical Value/emergent results were called by telephone at the time of interpretation on 12/18/2019 at 6:14 pm to Dr Dion Body , who verbally acknowledged these results. The patient was asked remain in the CT department by technologists, and referred directly to the emergency room for treatment. Electronically Signed   By: Keith Rake M.D.   On: 12/18/2019 18:15   MR BRAIN WO CONTRAST  Result Date: 12/11/2019 CLINICAL DATA:  Acute onset of dizziness and nausea/vomiting. EXAM: MRI HEAD WITHOUT CONTRAST TECHNIQUE: Multiplanar, multiecho pulse sequences of the brain and surrounding structures were obtained without intravenous contrast. COMPARISON:  CT head 12/10/2019, MRI head 09/05/2018 FINDINGS: Brain: No acute infarction, hemorrhage, hydrocephalus, extra-axial collection or mass lesion. Similar encephalomalacia associated with prior right parietal lobe infarct. Similar susceptibility artifact associated with this infarct, compatible with prior hemorrhage. Vascular: Major arterial flow voids are maintained at the skull base. Skull and upper cervical spine: Normal marrow signal. Sinuses/Orbits: Mild scattered mucosal thickening. No air-fluid levels. Unremarkable orbits. IMPRESSION: 1. No evidence of acute intracranial abnormality. Specifically, no acute infarct. 2. Remote right parietal infarct. Electronically  Signed   By: Margaretha Sheffield MD   On: 12/11/2019 08:00   ECHOCARDIOGRAM COMPLETE  Result Date: 12/19/2019    ECHOCARDIOGRAM REPORT   Patient Name:   Jason Doyle Date of Exam: 12/19/2019 Medical Rec #:  [id number]      Height:       74.0 in Accession #:    [id number]     Weight:       239.0 lb Date of Birth:  Nov 27, 1945      BSA:          2.344 m Patient Age:    54 years       BP:           171/98 mmHg Patient Gender: M              HR:           54 bpm. Exam Location:  [facility] Procedure: 2D Echo, Cardiac Doppler and Color Doppler Indications:     Pulmonary Embolus 415.19  History:         Patient has prior history of Echocardiogram examinations, most                  recent 08/21/2018. Risk Factors:Hypertension and Diabetes.  Sonographer:     Sherrie Sport RDCS (AE) Referring Phys:  [place] Diagnosing Phys: Isaias Cowman MD IMPRESSIONS  1. Left ventricular ejection fraction, by estimation, is 60 to 65%. The left ventricle has normal function. The left ventricle has no regional wall motion abnormalities. Left ventricular diastolic parameters are consistent with Grade I diastolic dysfunction (impaired relaxation).  2. Right ventricular systolic function is normal. The right ventricular size is normal.  3. The mitral valve is normal in structure. Trivial mitral valve regurgitation. No evidence of mitral stenosis.  4. The aortic valve is normal in structure. Aortic valve  regurgitation is not visualized. No aortic stenosis is present.  5. The inferior vena cava is normal in size with greater than 50% respiratory variability, suggesting right atrial pressure of 3 mmHg. FINDINGS  Left Ventricle: Left ventricular ejection fraction, by estimation, is 60 to 65%. The left ventricle has normal function. The left ventricle has no regional wall motion abnormalities. The left ventricular internal cavity size was normal in size. There is  no left ventricular hypertrophy. Left ventricular diastolic parameters are  consistent with Grade I diastolic dysfunction (impaired relaxation). Right Ventricle: The right ventricular size is normal. No increase in right ventricular wall thickness. Right ventricular systolic function is normal. Left Atrium: Left atrial size was normal in size. Right Atrium: Right atrial size was normal in size. Pericardium: There is no evidence of pericardial effusion. Mitral Valve: The mitral valve is normal in structure. Trivial mitral valve regurgitation. No evidence of mitral valve stenosis. Tricuspid Valve: The tricuspid valve is normal in structure. Tricuspid valve regurgitation is trivial. No evidence of tricuspid stenosis. Aortic Valve: The aortic valve is normal in structure. Aortic valve regurgitation is not visualized. No aortic stenosis is present. Aortic valve mean gradient measures 3.5 mmHg. Aortic valve peak gradient measures 5.9 mmHg. Aortic valve area, by VTI measures 2.92 cm. Pulmonic Valve: The pulmonic valve was normal in structure. Pulmonic valve regurgitation is not visualized. No evidence of pulmonic stenosis. Aorta: The aortic root is normal in size and structure. Venous: The inferior vena cava is normal in size with greater than 50% respiratory variability, suggesting right atrial pressure of 3 mmHg. IAS/Shunts: No atrial level shunt detected by color flow Doppler.  LEFT VENTRICLE PLAX 2D LVIDd:         4.68 cm  Diastology LV PW:         1.05 cm  LV e' medial:    4.68 cm/s LV IVS:        1.06 cm  LV E/e' medial:  12.7 LVOT diam:     2.00 cm  LV e' lateral:   6.09 cm/s LV SV:         72       LV E/e' lateral: 9.8 LV SV Index:   31 LVOT Area:     3.14 cm  RIGHT VENTRICLE RV Basal diam:  2.96 cm RV S prime:     13.70 cm/s TAPSE (M-mode): 3.9 cm LEFT ATRIUM             Index       RIGHT ATRIUM           Index LA diam:        3.30 cm 1.41 cm/m  RA Area:     20.10 cm LA Vol (A2C):   66.3 ml 28.28 ml/m RA Volume:   53.70 ml  22.91 ml/m LA Vol (A4C):   54.8 ml 23.37 ml/m LA Biplane  Vol: 60.2 ml 25.68 ml/m  AORTIC VALVE                   PULMONIC VALVE AV Area (Vmax):    2.50 cm    PV Vmax:        0.92 m/s AV Area (Vmean):   2.63 cm    PV Peak grad:   3.4 mmHg AV Area (VTI):     2.92 cm    RVOT Peak grad: 4 mmHg AV Vmax:           121.50 cm/s AV Vmean:  86.600 cm/s AV VTI:            0.246 m AV Peak Grad:      5.9 mmHg AV Mean Grad:      3.5 mmHg LVOT Vmax:         96.60 cm/s LVOT Vmean:        72.600 cm/s LVOT VTI:          0.229 m LVOT/AV VTI ratio: 0.93 MITRAL VALVE                TRICUSPID VALVE MV Area (PHT): 2.43 cm     TR Peak grad:   15.1 mmHg MV Decel Time: 312 msec     TR Vmax:        194.00 cm/s MV E velocity: 59.60 cm/s MV A velocity: 101.00 cm/s  SHUNTS MV E/A ratio:  0.59         Systemic VTI:  0.23 m                             Systemic Diam: 2.00 cm Isaias Cowman MD Electronically signed by Isaias Cowman MD Signature Date/Time: 12/19/2019/12:51:48 PM    Final    Assessment/Plan  1.  Pulmonary Embolus: Patient found to have CT angiogram of the chest shows positive for acute PE with moderate clot burden and evidence of right heart strain.  Currently, the patient is not short of breath or chest pain.  Patient was able to have become sedation on room air maintaining sats at 97%.  Patient has not tried to ambulate since admission.  Agree with the initiation of heparin.  Have asked the bedside nurse to please ambulate the patient and record oxygen saturation.  If there is a significant decrease in oxygen saturation with ambulation will forward with pulmonary thrombectomy / thrombolysis.  I did discuss the procedure, risks and benefits.  Did answer all questions.  2.  Type 2 diabetes: On appropriate medications. Encouraged good control as its slows the progression of atherosclerotic disease  3.  Hypercholesterolemia: On aspirin and statin. Encouraged good control as its slows the progression of atherosclerotic disease  Discussed with Dr.  Mayme Genta, PA-C  12/19/2019 2:38 PM  This note was created with Dragon medical transcription system.  Any error is purely unintentional.

## 2019-12-20 LAB — BASIC METABOLIC PANEL
Anion gap: 9 (ref 5–15)
BUN: 16 mg/dL (ref 8–23)
CO2: 24 mmol/L (ref 22–32)
Calcium: 8.6 mg/dL — ABNORMAL LOW (ref 8.9–10.3)
Chloride: 104 mmol/L (ref 98–111)
Creatinine, Ser: 1.31 mg/dL — ABNORMAL HIGH (ref 0.61–1.24)
GFR, Estimated: 57 mL/min — ABNORMAL LOW (ref >60)
Glucose, Bld: 111 mg/dL — ABNORMAL HIGH (ref 70–99)
Potassium: 4.4 mmol/L (ref 3.5–5.1)
Sodium: 137 mmol/L (ref 135–145)

## 2019-12-20 LAB — CBC
HCT: 48.1 % (ref 39.0–52.0)
Hemoglobin: 15.4 g/dL (ref 13.0–17.0)
MCH: 29.6 pg (ref 26.0–34.0)
MCHC: 32 g/dL (ref 30.0–36.0)
MCV: 92.3 fL (ref 80.0–100.0)
Platelets: 151 10*3/uL (ref 150–400)
RBC: 5.21 MIL/uL (ref 4.22–5.81)
RDW: 13.7 % (ref 11.5–15.5)
WBC: 6.5 10*3/uL (ref 4.0–10.5)
nRBC: 0 % (ref 0.0–0.2)

## 2019-12-20 LAB — MAGNESIUM: Magnesium: 1.9 mg/dL (ref 1.7–2.4)

## 2019-12-20 LAB — GLUCOSE, CAPILLARY
Glucose-Capillary: 94 mg/dL (ref 70–99)
Glucose-Capillary: 93 mg/dL (ref 70–99)

## 2019-12-20 LAB — HEPARIN LEVEL (UNFRACTIONATED)
Heparin Unfractionated: 0.6 IU/mL (ref 0.30–0.70)
Heparin Unfractionated: 0.73 IU/mL — ABNORMAL HIGH (ref 0.30–0.70)

## 2019-12-20 SURGERY — PULMONARY THROMBECTOMY
Anesthesia: Moderate Sedation

## 2019-12-20 MED ORDER — ACETAMINOPHEN 325 MG PO TABS: 650.0000 mg | ORAL_TABLET | Freq: Four times a day (QID) | ORAL | Status: DC | PRN

## 2019-12-20 MED ORDER — ACETAMINOPHEN 650 MG RE SUPP: 650.0000 mg | Freq: Four times a day (QID) | RECTAL | Status: DC | PRN

## 2019-12-20 MED ORDER — APIXABAN 5 MG PO TABS
10.0000 mg | ORAL_TABLET | Freq: Two times a day (BID) | ORAL | Status: DC
  Administered 2019-12-20: 10 mg via ORAL
  Filled 2019-12-20: qty 2

## 2019-12-20 MED ORDER — APIXABAN 5 MG PO TABS: 5.0000 mg | ORAL_TABLET | Freq: Two times a day (BID) | ORAL | Status: DC

## 2019-12-20 MED ORDER — APIXABAN 5 MG PO TABS: ORAL_TABLET | ORAL | 0 refills | Status: AC

## 2019-12-20 NOTE — Progress Notes (Signed)
Mobility Specialist - Progress Note   12/20/19 1100  Mobility  Activity Ambulated in hall  Level of Assistance Independent  Assistive Device None  Distance Ambulated (ft) 1450 ft  Mobility Response Tolerated well  Mobility performed by Mobility specialist  $Mobility charge 1 Mobility    Pre-mobility: 76 HR, 98% SpO2 During mobility: 95 HR, 95% SpO2 Post-mobility: 83 HR, 143/79 BP, 99% SpO2   Per discussion with physician and nurse, pt okay for ambulation this date. Pt was standing at bedside upon arrival, utilizing room air with wife present in room. Pt agreed to session. Pt denied any pain, nausea, or fatigue. Pt was independent in all transfers this date, including ambulation without AD. Pt ambulated ~1,500' in hallway with no LOB noted. Pt instructed to ambulate at his normal pace, pt showed understanding. Pt continuously denied SOB throughout session. Pt able to carryout great conversation during ambulation. Noticed some short breaks in between words, but with manageable breathing. Max HR at 95 bpm with O2 maintaining mid-high 90s throughout session. No rest breaks needed. Pt denied weakness or fatigue during and post session. During last lap around nursing unit, pt began c/o feeling slightly winded. Still denying SOB. Pt returned to room, BP reading at 143/79 post session. Pt eager to ambulate on the stairs later this evening. Pt was left EOB with all needs in reach. Nurse notified on performance.    Kathee Delton Mobility Specialist 12/20/19, 11:43 AM

## 2019-12-20 NOTE — Consult Note (Signed)
[place] for apixaban Indication: pulmonary embolus and DVT  Allergies  Allergen Reactions  . Metformin Diarrhea    Patient Measurements: Height: 6\' 2"  (188 cm) Weight: 108.4 kg (239 lb) IBW/kg (Calculated) : 82.2  Vital Signs: Temp: 98.6 F (37 C) (11/05 0733) Temp Source: Oral (11/05 0733) BP: 144/78 (11/05 0733) Pulse Rate: 57 (11/05 0733)  Labs: Recent Labs    12/18/19 1858 12/18/19 1858 12/18/19 1928 12/18/19 2203 12/19/19 0405 12/19/19 0405 12/19/19 1420 12/20/19 0120 12/20/19 0421 12/20/19 1027  HGB 16.5   < >  --   --  15.7  --   --   --  15.4  --   HCT 52.3*  --   --   --  49.4  --   --   --  48.1  --   PLT 151  --   --   --  149*  --   --   --  151  --   APTT  --   --  33  --   --   --   --   --   --   --   LABPROT  --   --  13.8  --   --   --   --   --   --   --   INR  --   --  1.1  --   --   --   --   --   --   --   HEPARINUNFRC  --   --   --   --  0.91*   < > 1.04* 0.60  --  0.73*  CREATININE 1.26*  --   --   --  1.20  --   --   --  1.31*  --   TROPONINIHS 33*  --   --  46*  --   --   --   --   --   --    < > = values in this interval not displayed.    Estimated Creatinine Clearance: 65.8 mL/min (A) (by C-G formula based on SCr of 1.31 mg/dL (H)).   Medical History: Past Medical History:  Diagnosis Date  . Ankylosing spondylitis ([place])   . Arthritis    knee,hands,back and neck  . Asthma    LAST ATTACK 4-5 YRS AGO  . BPH (benign prostatic hyperplasia)   . CVA (cerebral vascular accident) ([place])    08-20-2018 per patient  . Diabetes mellitus without complication ([place])    TYPE 2  . Gout   . H/O multiple allergies    SEASONAL,ENVIRONMENTAL  . H/O sinus surgery    x4  . Headache    sinus  . Hypertension   . Rotator cuff tear    left shoulder  . Sleep apnea    C-PAP    Medications:  Medications Prior to Admission  Medication Sig Dispense Refill Last Dose  . allopurinol (ZYLOPRIM) 100 MG tablet  Take 100 mg by mouth daily. am   12/18/2019 at 0800  . amLODipine (NORVASC) 5 MG tablet Take 5 mg by mouth daily at 6 (six) AM.   12/18/2019 at 0800  . aspirin 81 MG chewable tablet Chew 1 tablet (81 mg total) by mouth daily. Start on 08/31/2018 no earlier then that please thank you 120 tablet 0 12/18/2019 at 0800  . atorvastatin (LIPITOR) 80 MG tablet Take 40 mg by mouth every evening.    12/17/2019 at 2200  .  ENBREL SURECLICK 50 MG/ML injection Inject 50 mg into the skin every Wednesday.   12/18/2019 at 0800  . glipiZIDE (GLUCOTROL XL) 2.5 MG 24 hr tablet Take 2.5 mg by mouth daily with breakfast.   12/18/2019 at 0800  . JARDIANCE 25 MG TABS tablet Take 25 mg by mouth daily at 6 (six) AM.   12/18/2019 at 0800  . LACTASE ENZYME 3000 units tablet Take 3,000 Units by mouth daily at 6 (six) AM.   12/18/2019 at 0800  . lisinopril (PRINIVIL,ZESTRIL) 40 MG tablet Take 40 mg by mouth daily.   12/18/2019 at 0800  . loratadine (CLARITIN) 10 MG tablet Take 10 mg by mouth daily.   12/18/2019 at 0800  . metaxalone (SKELAXIN) 800 MG tablet Take 800 mg by mouth 3 (three) times daily.   12/18/2019 at 0800  . montelukast (SINGULAIR) 10 MG tablet Take 10 mg by mouth at bedtime.   12/17/2019 at 2200  . oxybutynin (DITROPAN) 5 MG tablet Take 5 mg by mouth 2 (two) times daily.   12/18/2019 at 0800  . tamsulosin (FLOMAX) 0.4 MG CAPS capsule Take 0.4 mg by mouth daily after breakfast.   12/18/2019 at 0800  . tiZANidine (ZANAFLEX) 4 MG tablet Take 4 mg by mouth at bedtime.   12/17/2019 at 2200  . WIXELA INHUB 250-50 MCG/DOSE AEPB Inhale 1 puff into the lungs 2 (two) times daily.   12/18/2019 at 0800  . Azelastine-Fluticasone 137-50 MCG/ACT SUSP Place 1 spray into the nose 2 (two) times daily as needed (congestion).    unknown at prn  . budesonide-formoterol (SYMBICORT) 160-4.5 MCG/ACT inhaler Inhale 2 puffs into the lungs 2 (two) times daily.  (Patient not taking: Reported on 12/18/2019)   Not Taking at Unknown time  . meclizine  (ANTIVERT) 25 MG tablet Take 1 tablet (25 mg total) by mouth 3 (three) times daily as needed for dizziness. (Patient not taking: Reported on 12/18/2019) 30 tablet 0 Not Taking at Unknown time  . oxymetazoline (AFRIN) 0.05 % nasal spray Place 2 sprays into both nostrils 2 (two) times daily as needed (nasal congestion). 15 mL 2 unknown at prn   Scheduled:  . allopurinol  100 mg Oral Daily  . amLODipine  5 mg Oral Q0600  . apixaban  10 mg Oral BID   Followed by  . [START ON 12/27/2019] apixaban  5 mg Oral BID  . atorvastatin  40 mg Oral QPM  . Chlorhexidine Gluconate Cloth  6 each Topical Daily  . insulin aspart  0-15 Units Subcutaneous TID WC  . insulin aspart  0-5 Units Subcutaneous QHS  . lactase  3,000 Units Oral Q0600  . lisinopril  40 mg Oral Daily  . loratadine  10 mg Oral Daily  . metaxalone  800 mg Oral TID  . mometasone-formoterol  2 puff Inhalation BID  . montelukast  10 mg Oral QHS  . oxybutynin  5 mg Oral BID  . tamsulosin  0.4 mg Oral QPC breakfast  . tiZANidine  4 mg Oral QHS   Infusions:  . sodium chloride 100 mL/hr at 12/20/19 0306   PRN: acetaminophen **OR** acetaminophen, calcium carbonate, fluticasone, hydrALAZINE, HYDROmorphone (DILAUDID) injection, ondansetron **OR** ondansetron (ZOFRAN) IV Anti-infectives (From admission, onward)   None      Assessment: Pharmacy consulted to transition patient off heparin to apixaban. At this point, the patient is satting upper 90s and asymptomatically at rest and with ambulation.  Goal of Therapy:  Monitor platelets by anticoagulation protocol: Yes   Plan:  Apixaban 10 mg BID x 7 days followed by 5 mg BID.   Oswald Hillock, PharmD, BCPS 12/20/2019,11:42 AM

## 2019-12-20 NOTE — Care Management Important Message (Signed)
Important Message  Patient Details  Name: Jason Doyle MRN: [id number] Date of Birth: [birth year]   Medicare Important Message Given:  N/A - LOS <3 / Initial given by admissions     Jason Doyle A Drako Maese 12/20/2019, 8:37 AM

## 2019-12-20 NOTE — Plan of Care (Signed)

## 2019-12-20 NOTE — Discharge Summary (Signed)
Physician Discharge Summary  Jason Doyle GHW:[id number] DOB: 31-Jan-1946 DOA: 12/18/2019  PCP: Jason Hector, MD  Admit date: 12/18/2019 Discharge date: 01/02/2020  Admitted From: home Disposition:  home  Recommendations for Outpatient Follow-up:  1. Follow up with PCP in 1-2 weeks 2. Please obtain BMP/CBC in one week 3. Please follow up with vascular surgery within 1-2 weeks  Home Health: No  Equipment/Devices: None   Discharge Condition: Stable  CODE STATUS: Full  Diet recommendation: Heart Healthy / Carb Modified    Discharge Diagnoses: Principal Problem:   Pulmonary embolism (HCC) Active Problems:   Asthma   Chronic sinusitis   Benign essential hypertension   History of benign prostatic hyperplasia   History of CVA (cerebrovascular accident)   Hypercholesterolemia   Type 2 diabetes mellitus with neurologic complication, without long-term current use of insulin ([place])    Summary of HPI and Hospital Course:  Jason Doyle is a 74 y.o. male with medical history significant of diabetes, hypertension, BPH, ankylosing spondylitis, sleep apnea on CPAP, seasonal allergies, history of CVA no longer on Eliquis and gout, presented to the ED on 11/3 after PCP had ordered a CTA chest to evaluate patient's ongoing shortness of breath, weakness and dizziness.  The CTA showed bilateral Pe's with RV strain.    Admitted to hospitalist service with vascular surgery consulted.  On heparin infusion.    Acute bilateral PE's -present on admission.  With signs of right heart strain on CTA chest.  Patient remained hemodynamically stable and maintaining O2 sats on room air throughout admission.   Echo on 11/4 showed normal EF of 60 to 65%, no regional wall motion abnormalities, grade 1 diastolic dysfunction. --Vascular surgery consulted, did not require thrombolysis/thrombectomy.  Follow up in 1-2 weeks. --Resumed on diet --Transition from heparin drip to Eliquis and tolerating  well --Maintained oxygen saturations on room air with ambulation, no need for home oxygen at this time   History of nonsustained V. Tach - monitored on telemetry.  Maintain K> 4.0 and Mg> 2.0.  Type 2 diabetes -covered with sliding scale NovoLog during admission and resumed on home regimen at d/c.  Essential hypertension -chronic, stable.  Resumed on home medications.  History of asthma -without exacerbation, stable.  Hyperlipidemia -continue on statin  History of CVA -without residual neurologic deficits.    Gout -continue allopurinol  Ankylosing spondylitis -continue Skelaxin, Zanaflex at bedtime  Obesity: Body mass index is 30.69 kg/m.  Complicates overall care and prognosis.   Discharge Instructions   Discharge Instructions    Call MD for:   Complete by: As directed    Worsening shortness of breath, low BP with activity, or chest pain   Call MD for:  extreme fatigue   Complete by: As directed    Call MD for:  persistant dizziness or light-headedness   Complete by: As directed    Call MD for:  persistant nausea and vomiting   Complete by: As directed    Call MD for:  temperature >100.4   Complete by: As directed    Diet - low sodium heart healthy   Complete by: As directed    Discharge instructions   Complete by: As directed    Take Eliquis (apixaban) as directed - you will take 10 mg twice daily for 1 week, then reduce to 5 mg twice daily. Follow up in vascular clinic in 1 week.  Call them if you start feeling more short of breath with activity at home between  now and then.    Increase your physical activity slowly, and as tolerated.   Increase activity slowly   Complete by: As directed      Allergies as of 12/20/2019      Reactions   Metformin Diarrhea      Medication List    TAKE these medications   allopurinol 100 MG tablet Commonly known as: ZYLOPRIM Take 100 mg by mouth daily. am   amLODipine 5 MG tablet Commonly known as: NORVASC Take  5 mg by mouth daily at 6 (six) AM.   apixaban 5 MG Tabs tablet Commonly known as: Eliquis Take 2 tablets (10mg ) twice daily for 7 days, then 1 tablet (5mg ) twice daily   aspirin 81 MG chewable tablet Chew 1 tablet (81 mg total) by mouth daily. Start on 08/31/2018 no earlier then that please thank you   atorvastatin 80 MG tablet Commonly known as: LIPITOR Take 40 mg by mouth every evening.   Azelastine-Fluticasone 137-50 MCG/ACT Susp Place 1 spray into the nose 2 (two) times daily as needed (congestion).   budesonide-formoterol 160-4.5 MCG/ACT inhaler Commonly known as: SYMBICORT Inhale 2 puffs into the lungs 2 (two) times daily.   Enbrel SureClick 50 MG/ML injection Generic drug: etanercept Inject 50 mg into the skin every Wednesday.   glipiZIDE 2.5 MG 24 hr tablet Commonly known as: GLUCOTROL XL Take 2.5 mg by mouth daily with breakfast.   Jardiance 25 MG Tabs tablet Generic drug: empagliflozin Take 25 mg by mouth daily at 6 (six) AM.   Lactase Enzyme 3000 units tablet Generic drug: lactase Take 3,000 Units by mouth daily at 6 (six) AM.   lisinopril 40 MG tablet Commonly known as: ZESTRIL Take 40 mg by mouth daily.   loratadine 10 MG tablet Commonly known as: CLARITIN Take 10 mg by mouth daily.   meclizine 25 MG tablet Commonly known as: ANTIVERT Take 1 tablet (25 mg total) by mouth 3 (three) times daily as needed for dizziness.   metaxalone 800 MG tablet Commonly known as: SKELAXIN Take 800 mg by mouth 3 (three) times daily.   montelukast 10 MG tablet Commonly known as: SINGULAIR Take 10 mg by mouth at bedtime.   oxybutynin 5 MG tablet Commonly known as: DITROPAN Take 5 mg by mouth 2 (two) times daily.   oxymetazoline 0.05 % nasal spray Commonly known as: AFRIN Place 2 sprays into both nostrils 2 (two) times daily as needed (nasal congestion).   tamsulosin 0.4 MG Caps capsule Commonly known as: FLOMAX Take 0.4 mg by mouth daily after breakfast.    tiZANidine 4 MG tablet Commonly known as: ZANAFLEX Take 4 mg by mouth at bedtime.   Wixela Inhub 250-50 MCG/DOSE Aepb Generic drug: Fluticasone-Salmeterol Inhale 1 puff into the lungs 2 (two) times daily.       Allergies  Allergen Reactions  . Metformin Diarrhea    Consultations:  Vascular surgery    Procedures/Studies: CT Head Wo Contrast  Result Date: 12/10/2019 CLINICAL DATA:  Acute onset dizziness and nausea EXAM: CT HEAD WITHOUT CONTRAST TECHNIQUE: Contiguous axial images were obtained from the base of the skull through the vertex without intravenous contrast. COMPARISON:  CT 01/09/2019, MRI 09/05/2018 FINDINGS: Brain: Redemonstration of a region of encephalomalacia in the right parietal lobe, unchanged from comparison imaging. No evidence of acute infarction, hemorrhage, hydrocephalus, extra-axial collection, visible mass lesion or mass effect. Symmetric prominence of the ventricles, cisterns and sulci compatible with parenchymal volume loss. Patchy areas of white matter hypoattenuation are most  compatible with chronic microvascular angiopathy. Vascular: Atherosclerotic calcification of the carotid siphons and intradural vertebral arteries. No hyperdense vessel. Skull: No calvarial fracture or suspicious osseous lesion. No scalp swelling or hematoma. Sinuses/Orbits: Paranasal sinuses and mastoid air cells are predominantly clear with evidence of prior sinus surgery including bilateral ethmoidectomy and maxillary antrostomy. Included orbital structures are unremarkable. Other: None. IMPRESSION: 1. No acute intracranial abnormality. 2. 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 3. 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 4. Prior sinus surgery. Electronically Signed   By: Lovena Le M.D.   On: 12/10/2019 20:05   CT ANGIO CHEST PE W OR WO CONTRAST  Result Date: 12/18/2019 CLINICAL DATA:  Shortness of breath and elevated D-dimer. EXAM:  CT ANGIOGRAPHY CHEST WITH CONTRAST TECHNIQUE: Multidetector CT imaging of the chest was performed using the standard protocol during bolus administration of intravenous contrast. Multiplanar CT image reconstructions and MIPs were obtained to evaluate the vascular anatomy. CONTRAST:  29mL OMNIPAQUE IOHEXOL 350 MG/ML SOLN COMPARISON:  Report from radiograph earlier today. Images not available. FINDINGS: Cardiovascular: Positive for acute pulmonary embolus with filling defect involving the distal main pulmonary artery extending into the upper and lower lobar branches. Lesser pulmonary emboli on the right. Segmental filling defect in the right upper lobe, series 4, image 44. There is subsegmental filling defects in the right lower lobe, series 4 images 59 and 71. There is slight straightening of the intraventricular septum and right heart strain with RV to LV ratio of 1.1. Mild cardiomegaly. Mild aortic atherosclerosis without aortic dissection. Occasional coronary artery calcifications. Trace pericardial effusion. Mediastinum/Nodes: Scattered small mediastinal lymph nodes are not enlarged by size criteria. There is no hilar adenopathy. Patulous esophagus without wall thickening. No visualized thyroid nodule. Lungs/Pleura: There are scattered nodular densities within the left lower lobe and right middle lobe, all measuring 9-10 mm with indistinct margins and slight surrounding ground-glass. Trace left pleural effusion. Linear atelectasis in the lingula. No findings of pulmonary edema. Upper Abdomen: Nodular hepatic contours suspicious for cirrhosis. No acute upper abdominal findings. Musculoskeletal: There is thin flowing syndesmophytes throughout the thoracic spine with slight spurring of the vertebral bodies typical of ankylosing spondylitis. There is also fusion of the spinous processes. No fracture or acute osseous abnormalities. Review of the MIP images confirms the above findings. IMPRESSION: 1. Positive for acute  pulmonary embolus with moderate clot burden and evidence of right heart strain, RV to LV ratio of 1.1. Greatest thromboembolic burden is on the left and involves the distal main left pulmonary artery. 2. Scattered nodular densities within the left lower lobe and right middle lobe, all measuring 9-10 mm with indistinct margins and slight surrounding ground-glass. Findings may be infectious or inflammatory, however recommend follow-up chest CT in 3 months to ensure resolution. Possibility of septic emboli is raised. 3. Trace left pleural effusion. 4. 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 5. Ankylosing spondylitis. Aortic Atherosclerosis (ICD10-I70.0). Critical Value/emergent results were called by telephone at the time of interpretation on 12/18/2019 at 6:14 pm to Dr Dion Body , who verbally acknowledged these results. The patient was asked remain in the CT department by technologists, and referred directly to the emergency room for treatment. Electronically Signed   By: Keith Rake M.D.   On: 12/18/2019 18:15   MR BRAIN WO CONTRAST  Result Date: 12/11/2019 CLINICAL DATA:  Acute onset of dizziness and nausea/vomiting. EXAM: MRI HEAD WITHOUT CONTRAST TECHNIQUE: Multiplanar, multiecho pulse sequences of the brain and  surrounding structures were obtained without intravenous contrast. COMPARISON:  CT head 12/10/2019, MRI head 09/05/2018 FINDINGS: Brain: No acute infarction, hemorrhage, hydrocephalus, extra-axial collection or mass lesion. Similar encephalomalacia associated with prior right parietal lobe infarct. Similar susceptibility artifact associated with this infarct, compatible with prior hemorrhage. Vascular: Major arterial flow voids are maintained at the skull base. Skull and upper cervical spine: Normal marrow signal. Sinuses/Orbits: Mild scattered mucosal thickening. No air-fluid levels. Unremarkable orbits. IMPRESSION: 1. No evidence of acute intracranial abnormality.  Specifically, no acute infarct. 2. Remote right parietal infarct. Electronically Signed   By: Margaretha Sheffield MD   On: 12/11/2019 08:00   ECHOCARDIOGRAM COMPLETE  Result Date: 12/19/2019    ECHOCARDIOGRAM REPORT   Patient Name:   Jason Doyle Date of Exam: 12/19/2019 Medical Rec #:  [id number]      Height:       74.0 in Accession #:    [id number]     Weight:       239.0 lb Date of Birth:  02-Aug-1945      BSA:          2.344 m Patient Age:    89 years       BP:           171/98 mmHg Patient Gender: M              HR:           54 bpm. Exam Location:  [facility] Procedure: 2D Echo, Cardiac Doppler and Color Doppler Indications:     Pulmonary Embolus 415.19  History:         Patient has prior history of Echocardiogram examinations, most                  recent 08/21/2018. Risk Factors:Hypertension and Diabetes.  Sonographer:     Sherrie Sport RDCS (AE) Referring Phys:  [place] Diagnosing Phys: Isaias Cowman MD IMPRESSIONS  1. Left ventricular ejection fraction, by estimation, is 60 to 65%. The left ventricle has normal function. The left ventricle has no regional wall motion abnormalities. Left ventricular diastolic parameters are consistent with Grade I diastolic dysfunction (impaired relaxation).  2. Right ventricular systolic function is normal. The right ventricular size is normal.  3. The mitral valve is normal in structure. Trivial mitral valve regurgitation. No evidence of mitral stenosis.  4. The aortic valve is normal in structure. Aortic valve regurgitation is not visualized. No aortic stenosis is present.  5. The inferior vena cava is normal in size with greater than 50% respiratory variability, suggesting right atrial pressure of 3 mmHg. FINDINGS  Left Ventricle: Left ventricular ejection fraction, by estimation, is 60 to 65%. The left ventricle has normal function. The left ventricle has no regional wall motion abnormalities. The left ventricular internal cavity size was normal in size.  There is  no left ventricular hypertrophy. Left ventricular diastolic parameters are consistent with Grade I diastolic dysfunction (impaired relaxation). Right Ventricle: The right ventricular size is normal. No increase in right ventricular wall thickness. Right ventricular systolic function is normal. Left Atrium: Left atrial size was normal in size. Right Atrium: Right atrial size was normal in size. Pericardium: There is no evidence of pericardial effusion. Mitral Valve: The mitral valve is normal in structure. Trivial mitral valve regurgitation. No evidence of mitral valve stenosis. Tricuspid Valve: The tricuspid valve is normal in structure. Tricuspid valve regurgitation is trivial. No evidence of tricuspid stenosis. Aortic Valve: The aortic valve is normal  in structure. Aortic valve regurgitation is not visualized. No aortic stenosis is present. Aortic valve mean gradient measures 3.5 mmHg. Aortic valve peak gradient measures 5.9 mmHg. Aortic valve area, by VTI measures 2.92 cm. Pulmonic Valve: The pulmonic valve was normal in structure. Pulmonic valve regurgitation is not visualized. No evidence of pulmonic stenosis. Aorta: The aortic root is normal in size and structure. Venous: The inferior vena cava is normal in size with greater than 50% respiratory variability, suggesting right atrial pressure of 3 mmHg. IAS/Shunts: No atrial level shunt detected by color flow Doppler.  LEFT VENTRICLE PLAX 2D LVIDd:         4.68 cm  Diastology LV PW:         1.05 cm  LV e' medial:    4.68 cm/s LV IVS:        1.06 cm  LV E/e' medial:  12.7 LVOT diam:     2.00 cm  LV e' lateral:   6.09 cm/s LV SV:         72       LV E/e' lateral: 9.8 LV SV Index:   31 LVOT Area:     3.14 cm  RIGHT VENTRICLE RV Basal diam:  2.96 cm RV S prime:     13.70 cm/s TAPSE (M-mode): 3.9 cm LEFT ATRIUM             Index       RIGHT ATRIUM           Index LA diam:        3.30 cm 1.41 cm/m  RA Area:     20.10 cm LA Vol (A2C):   66.3 ml 28.28  ml/m RA Volume:   53.70 ml  22.91 ml/m LA Vol (A4C):   54.8 ml 23.37 ml/m LA Biplane Vol: 60.2 ml 25.68 ml/m  AORTIC VALVE                   PULMONIC VALVE AV Area (Vmax):    2.50 cm    PV Vmax:        0.92 m/s AV Area (Vmean):   2.63 cm    PV Peak grad:   3.4 mmHg AV Area (VTI):     2.92 cm    RVOT Peak grad: 4 mmHg AV Vmax:           121.50 cm/s AV Vmean:          86.600 cm/s AV VTI:            0.246 m AV Peak Grad:      5.9 mmHg AV Mean Grad:      3.5 mmHg LVOT Vmax:         96.60 cm/s LVOT Vmean:        72.600 cm/s LVOT VTI:          0.229 m LVOT/AV VTI ratio: 0.93 MITRAL VALVE                TRICUSPID VALVE MV Area (PHT): 2.43 cm     TR Peak grad:   15.1 mmHg MV Decel Time: 312 msec     TR Vmax:        194.00 cm/s MV E velocity: 59.60 cm/s MV A velocity: 101.00 cm/s  SHUNTS MV E/A ratio:  0.59         Systemic VTI:  0.23 m  Systemic Diam: 2.00 cm Isaias Cowman MD Electronically signed by Isaias Cowman MD Signature Date/Time: 12/19/2019/12:51:48 PM    Final        Subjective: Pt seen with wife at bedside today.  He ambulated around unit earlier and felt good.  No dizziness or lightheadedness, chest pain, palpitations or shortness of breath.  He is very eager to get back to his usual long walks.   Discharge Exam: Vitals:   12/20/19 0733 12/20/19 1144  BP: (!) 144/78 130/78  Pulse: (!) 57 67  Resp: 18 17  Temp: 98.6 F (37 C) 97.8 F (36.6 C)  SpO2: 97% 99%   Vitals:   12/19/19 2232 12/20/19 0358 12/20/19 0733 12/20/19 1144  BP: 125/89 116/73 (!) 144/78 130/78  Pulse: 79 (!) 58 (!) 57 67  Resp: 20 20 18 17   Temp: 98.2 F (36.8 C) 98.2 F (36.8 C) 98.6 F (37 C) 97.8 F (36.6 C)  TempSrc: Oral Oral Oral Oral  SpO2: 98% 98% 97% 99%  Weight:      Height:        General: Pt is alert, awake, not in acute distress Cardiovascular: RRR, S1/S2 +, no rubs, no gallops Respiratory: CTA bilaterally, no wheezing, no rhonchi Abdominal: Soft, NT,  ND, bowel sounds + Extremities: no edema, no cyanosis    The results of significant diagnostics from this hospitalization (including imaging, microbiology, ancillary and laboratory) are listed below for reference.     Microbiology: No results found for this or any previous visit (from the past 240 hour(s)).   Labs: BNP (last 3 results) Recent Labs    12/18/19 1858  BNP 99.3   Basic Metabolic Panel: No results for input(s): NA, K, CL, CO2, GLUCOSE, BUN, CREATININE, CALCIUM, MG, PHOS in the last 168 hours. Liver Function Tests: No results for input(s): AST, ALT, ALKPHOS, BILITOT, PROT, ALBUMIN in the last 168 hours. No results for input(s): LIPASE, AMYLASE in the last 168 hours. No results for input(s): AMMONIA in the last 168 hours. CBC: No results for input(s): WBC, NEUTROABS, HGB, HCT, MCV, PLT in the last 168 hours. Cardiac Enzymes: No results for input(s): CKTOTAL, CKMB, CKMBINDEX, TROPONINI in the last 168 hours. BNP: Invalid input(s): POCBNP CBG: No results for input(s): GLUCAP in the last 168 hours. D-Dimer No results for input(s): DDIMER in the last 72 hours. Hgb A1c No results for input(s): HGBA1C in the last 72 hours. Lipid Profile No results for input(s): CHOL, HDL, LDLCALC, TRIG, CHOLHDL, LDLDIRECT in the last 72 hours. Thyroid function studies No results for input(s): TSH, T4TOTAL, T3FREE, THYROIDAB in the last 72 hours.  Invalid input(s): FREET3 Anemia work up No results for input(s): VITAMINB12, FOLATE, FERRITIN, TIBC, IRON, RETICCTPCT in the last 72 hours. Urinalysis    Component Value Date/Time   COLORURINE STRAW (A) 01/09/2019 1024   APPEARANCEUR CLEAR (A) 01/09/2019 1024   LABSPEC 1.011 01/09/2019 1024   PHURINE 6.0 01/09/2019 1024   GLUCOSEU >=500 (A) 01/09/2019 1024   HGBUR NEGATIVE 01/09/2019 1024   BILIRUBINUR NEGATIVE 01/09/2019 1024   KETONESUR NEGATIVE 01/09/2019 1024   PROTEINUR NEGATIVE 01/09/2019 1024   NITRITE NEGATIVE 01/09/2019  1024   LEUKOCYTESUR NEGATIVE 01/09/2019 1024   Sepsis Labs Invalid input(s): PROCALCITONIN,  WBC,  LACTICIDVEN Microbiology No results found for this or any previous visit (from the past 240 hour(s)).   Time coordinating discharge: Over 30 minutes  SIGNED:   Ezekiel Slocumb, DO [facility] 01/02/2020, 5:40 PM   If 7PM-7AM, please contact  night-coverage [email or web address]

## 2019-12-20 NOTE — Progress Notes (Signed)
[facility] Daily Progress Note  Subjective: Patient denies any shortness of breath or chest pain this AM.  No issues overnight.  No significant shortness of breath or chest pain with ambulation yesterday.  Objective: Vitals:   12/19/19 2100 12/19/19 2232 12/20/19 0358 12/20/19 0733  BP: (!) 144/73 125/89 116/73 (!) 144/78  Pulse: 62 79 (!) 58 (!) 57  Resp: 10 20 20 18   Temp:  98.2 F (36.8 C) 98.2 F (36.8 C) 98.6 F (37 C)  TempSrc:  Oral Oral Oral  SpO2: 98% 98% 98% 97%  Weight:      Height:        Intake/Output Summary (Last 24 hours) at 12/20/2019 1046 Last data filed at 12/20/2019 0733 Gross per 24 hour  Intake 2260.19 ml  Output 1075 ml  Net 1185.19 ml   Physical Exam: A&Ox3, NAD CV: RRR Pulmonary: CTA Bilaterally Abdomen: Soft, Nontender, Nondistended Vascular: Warm distally toes   Laboratory: CBC    Component Value Date/Time   WBC 6.5 12/20/2019 0421   HGB 15.4 12/20/2019 0421   HGB 15.7 10/12/2011 0922   HCT 48.1 12/20/2019 0421   HCT 48.1 10/12/2011 0922   PLT 151 12/20/2019 0421   PLT 173 10/05/2011 0855   BMET    Component Value Date/Time   NA 137 12/20/2019 0421   K 4.4 12/20/2019 0421   CL 104 12/20/2019 0421   CO2 24 12/20/2019 0421   GLUCOSE 111 (H) 12/20/2019 0421   BUN 16 12/20/2019 0421   CREATININE 1.31 (H) 12/20/2019 0421   CALCIUM 8.6 (L) 12/20/2019 0421   GFRNONAA 57 (L) 12/20/2019 0421   GFRAA >60 04/27/2019 0435   Assessment/Planning: The patient is a 74 year old male who presented with PE  1) At rest, patient without shortness of breath or chest pain.  Satting in upper 90s. 2) with ambulation yesterday patient sat no lower than 92% without chest pain or shortness of breath. 3) patient is concerned this AM that he has not ambulated far enough or with stairs.  Bedside nurse states that she will ambulate with him for longer period of time and with stairs. 4) had long conversation with patient that we have  approximately 2 weeks to move forward or not with the procedure.  At this point, the patient is satting upper 90s and asymptomatically at rest and with ambulation. 5) Would transition over to p.o. anticoagulation  Discussed with Eliot Ford PA-C 12/20/2019 10:46 AM

## 2019-12-20 NOTE — Progress Notes (Signed)
ANTICOAGULATION CONSULT NOTE  Pharmacy Consult for heparin Indication: pulmonary embolus  Patient Measurements: Height: 6\' 2"  (188 cm) Weight: 108.4 kg (239 lb) IBW/kg (Calculated) : 82.2 Heparin Dosing Weight: 104 kg  Labs: Recent Labs    12/18/19 1750 12/18/19 1858 12/18/19 1928 12/18/19 2203 12/19/19 0405 12/19/19 1420 12/20/19 0120  HGB  --  16.5  --   --  15.7  --   --   HCT  --  52.3*  --   --  49.4  --   --   PLT  --  151  --   --  149*  --   --   APTT  --   --  33  --   --   --   --   LABPROT  --   --  13.8  --   --   --   --   INR  --   --  1.1  --   --   --   --   HEPARINUNFRC  --   --   --   --  0.91* 1.04* 0.60  CREATININE 1.30* 1.26*  --   --  1.20  --   --   TROPONINIHS  --  33*  --  46*  --   --   --     Estimated Creatinine Clearance: 71.9 mL/min (by C-G formula based on SCr of 1.2 mg/dL).   Medical History: Past Medical History:  Diagnosis Date  . Ankylosing spondylitis ([place])   . Arthritis    knee,hands,back and neck  . Asthma    LAST ATTACK 4-5 YRS AGO  . BPH (benign prostatic hyperplasia)   . CVA (cerebral vascular accident) ([place])    08-20-2018 per patient  . Diabetes mellitus without complication ([place])    TYPE 2  . Gout   . H/O multiple allergies    SEASONAL,ENVIRONMENTAL  . H/O sinus surgery    x4  . Headache    sinus  . Hypertension   . Rotator cuff tear    left shoulder  . Sleep apnea    C-PAP    Assessment: Patient is a 74 year old male presented after having imaging done earlier today positive for acute PE with moderate clot burden and evidence of right heart strain. Patient previously on apixaban after a stroke but no longer actively on anticoagulation. Pharmacy consulted for heparin drip for PE treatment.   Goal of Therapy:  Heparin level 0.3-0.7 units/ml Monitor platelets by anticoagulation protocol: Yes   Plan:  --11/4 at 1420 HL = 1.04, supratherapeutic. Will hold heparin infusion x 1 hour and then re-start drip at  decreased rate of 1300 units/hr --HL 8 hours after rate change --Daily CBC per protocol  11/5:  HL @ 0120 = 0.6 Will continue this pt on current rate and draw confirmation level in 8 hrs.   Noe Pittsley D 12/20/2019,5:04 AM

## 2019-12-23 ENCOUNTER — Telehealth (INDEPENDENT_AMBULATORY_CARE_PROVIDER_SITE_OTHER): Payer: Self-pay

## 2019-12-23 NOTE — Telephone Encounter (Signed)
Patient left a voicemail stating he need to schedule for follow up this week per discharge. Per Dr Lucky Cowboy the patient should come in need to see if he has shortness or breath since discharge as we could still do a pulmonary thrombectomy until some time next week.

## 2019-12-27 ENCOUNTER — Other Ambulatory Visit: Payer: Self-pay

## 2019-12-27 ENCOUNTER — Encounter (INDEPENDENT_AMBULATORY_CARE_PROVIDER_SITE_OTHER): Payer: Self-pay | Admitting: Vascular Surgery

## 2019-12-27 ENCOUNTER — Ambulatory Visit (INDEPENDENT_AMBULATORY_CARE_PROVIDER_SITE_OTHER): Payer: Medicare Other | Admitting: Vascular Surgery

## 2019-12-27 VITALS — BP 127/76 | HR 75 | Ht 72.0 in | Wt 237.0 lb

## 2019-12-27 DIAGNOSIS — E349 Endocrine disorder, unspecified: Secondary | ICD-10-CM | POA: Insufficient documentation

## 2019-12-27 DIAGNOSIS — G63 Polyneuropathy in diseases classified elsewhere: Secondary | ICD-10-CM | POA: Insufficient documentation

## 2019-12-27 DIAGNOSIS — E119 Type 2 diabetes mellitus without complications: Secondary | ICD-10-CM | POA: Insufficient documentation

## 2019-12-27 DIAGNOSIS — Z8669 Personal history of other diseases of the nervous system and sense organs: Secondary | ICD-10-CM | POA: Insufficient documentation

## 2019-12-27 DIAGNOSIS — I10 Essential (primary) hypertension: Secondary | ICD-10-CM

## 2019-12-27 DIAGNOSIS — E1140 Type 2 diabetes mellitus with diabetic neuropathy, unspecified: Secondary | ICD-10-CM | POA: Diagnosis not present

## 2019-12-27 DIAGNOSIS — I2694 Multiple subsegmental pulmonary emboli without acute cor pulmonale: Secondary | ICD-10-CM | POA: Diagnosis not present

## 2019-12-27 DIAGNOSIS — M109 Gout, unspecified: Secondary | ICD-10-CM | POA: Insufficient documentation

## 2019-12-27 DIAGNOSIS — E559 Vitamin D deficiency, unspecified: Secondary | ICD-10-CM | POA: Insufficient documentation

## 2019-12-27 DIAGNOSIS — D72820 Lymphocytosis (symptomatic): Secondary | ICD-10-CM | POA: Insufficient documentation

## 2019-12-27 NOTE — Assessment & Plan Note (Signed)
The patient is tolerating anticoagulation well and doing well symptomatically.  No role for intervention in his situation.  He will neatly 6 months of full anticoagulation.  I can see him back at that time to discuss options for ongoing anticoagulation therapy which would include changing to aspirin, using a lower dose of anticoagulation, or staying on full dose anticoagulation.

## 2019-12-27 NOTE — Assessment & Plan Note (Signed)
blood glucose control important in reducing the progression of atherosclerotic disease. Also, involved in wound healing. On appropriate medications.  

## 2019-12-27 NOTE — Progress Notes (Signed)
MRN : [id number]  Jason Doyle is a 74 y.o. ([birth year]) male who presents with chief complaint of  Chief Complaint  Patient presents with  . New Patient (Initial Visit)    Add on per phon note determin SOB since D/C  .  History of Present Illness: Patient returns today in follow up of his pulmonary embolus.  He was seen in the hospital for a fairly large left-sided pulmonary embolus, he did very well with anticoagulation alone.  Within 24 to 48 hours he was pain-free with essentially no shortness of breath or physical limitations.  He remains that way.  He is tolerating anticoagulation well.  No signs of bleeding.  No new complaints today.  Current Outpatient Medications  Medication Sig Dispense Refill  . allopurinol (ZYLOPRIM) 100 MG tablet Take 100 mg by mouth daily. am    . apixaban (ELIQUIS) 5 MG TABS tablet Take 2 tablets (10mg ) twice daily for 7 days, then 1 tablet (5mg ) twice daily 60 tablet 0  . aspirin 81 MG chewable tablet Chew 1 tablet (81 mg total) by mouth daily. Start on 08/31/2018 no earlier then that please thank you 120 tablet 0  . atorvastatin (LIPITOR) 80 MG tablet Take 40 mg by mouth every evening.     . Azelastine-Fluticasone 137-50 MCG/ACT SUSP Place 1 spray into the nose 2 (two) times daily as needed (congestion).     . budesonide (PULMICORT) 0.25 MG/2ML nebulizer solution ADD 10ML (ONE DOSE) OF MEDICATION TO 250ML OF SALINE IN SINUS RINSE BOTTLE. IRRIGATE SINUSES WITH 120ML THROUGH EACH NOSTRIL TWICE DAILY    . ENBREL SURECLICK 50 MG/ML injection Inject 50 mg into the skin every Wednesday.    Marland Kitchen glipiZIDE (GLUCOTROL XL) 2.5 MG 24 hr tablet Take 2.5 mg by mouth daily with breakfast.    . JARDIANCE 25 MG TABS tablet Take 25 mg by mouth daily at 6 (six) AM.    . LACTASE ENZYME 3000 units tablet Take 3,000 Units by mouth daily at 6 (six) AM.    . lisinopril (PRINIVIL,ZESTRIL) 40 MG tablet Take 40 mg by mouth daily.    Marland Kitchen loratadine (CLARITIN) 10 MG tablet Take 10 mg by  mouth daily.    . metaxalone (SKELAXIN) 800 MG tablet Take 800 mg by mouth 3 (three) times daily.    . montelukast (SINGULAIR) 10 MG tablet Take 10 mg by mouth at bedtime.    Marland Kitchen oxybutynin (DITROPAN) 5 MG tablet Take 5 mg by mouth 2 (two) times daily.    Marland Kitchen oxymetazoline (AFRIN) 0.05 % nasal spray Place 2 sprays into both nostrils 2 (two) times daily as needed (nasal congestion). 15 mL 2  . tamsulosin (FLOMAX) 0.4 MG CAPS capsule Take 0.4 mg by mouth daily after breakfast.    . tiZANidine (ZANAFLEX) 4 MG tablet Take 4 mg by mouth at bedtime.    Grant Ruts INHUB 250-50 MCG/DOSE AEPB Inhale 1 puff into the lungs 2 (two) times daily.    Marland Kitchen amLODipine (NORVASC) 5 MG tablet Take 5 mg by mouth daily at 6 (six) AM. (Patient not taking: Reported on 12/27/2019)    . budesonide-formoterol (SYMBICORT) 160-4.5 MCG/ACT inhaler Inhale 2 puffs into the lungs 2 (two) times daily.  (Patient not taking: Reported on 12/18/2019)    . meclizine (ANTIVERT) 25 MG tablet Take 1 tablet (25 mg total) by mouth 3 (three) times daily as needed for dizziness. (Patient not taking: Reported on 12/18/2019) 30 tablet 0   No current facility-administered medications  for this visit.    Past Medical History:  Diagnosis Date  . Ankylosing spondylitis ([place])   . Arthritis    knee,hands,back and neck  . Asthma    LAST ATTACK 4-5 YRS AGO  . BPH (benign prostatic hyperplasia)   . CVA (cerebral vascular accident) ([place])    08-20-2018 per patient  . Diabetes mellitus without complication ([place])    TYPE 2  . Gout   . H/O multiple allergies    SEASONAL,ENVIRONMENTAL  . H/O sinus surgery    x4  . Headache    sinus  . Hypertension   . Pulmonary embolism ([place]) 12/18/2019  . Rotator cuff tear    left shoulder  . Sleep apnea    C-PAP    Past Surgical History:  Procedure Laterality Date  . BICEPT TENODESIS Left 07/23/2014   Procedure: BICEPS TENODESIS;  Surgeon: [name], MD;  Location: [facility];  Service: Orthopedics;   Laterality: Left;  . COLONOSCOPY WITH PROPOFOL N/A 04/10/2017   Procedure: COLONOSCOPY WITH PROPOFOL;  Surgeon: [name], MD;  Location: [facility] ENDOSCOPY;  Service: Endoscopy;  Laterality: N/A;  . KNEE ARTHROSCOPY    . LOOP RECORDER INSERTION N/A 11/20/2018   Procedure: LOOP RECORDER INSERTION;  Surgeon: Isaias Cowman, MD;  Location: [facility] CV LAB;  Service: Cardiovascular;  Laterality: N/A;  . NASAL SINUS SURGERY     x 4  . SHOULDER ARTHROSCOPY WITH ROTATOR CUFF REPAIR AND SUBACROMIAL DECOMPRESSION Left 07/23/2014   Procedure: SHOULDER ARTHROSCOPY WITH ROTATOR CUFF REPAIR AND SUBACROMIAL DECOMPRESSION;  Surgeon: [name], MD;  Location: [facility];  Service: Orthopedics;  Laterality: Left;  . UVULOPALATOPHARYNGOPLASTY       Social History   Tobacco Use  . Smoking status: Never Smoker  . Smokeless tobacco: Never Used  Vaping Use  . Vaping Use: Never used  Substance Use Topics  . Alcohol use: Not Currently    Comment: RARELY "1/2 glass wine a year"  . Drug use: Not Currently    Family History No bleeding or clotting disorders  Allergies  Allergen Reactions  . Metformin Diarrhea     REVIEW OF SYSTEMS (Negative unless checked)  Constitutional: [] Weight loss  [] Fever  [] Chills Cardiac: [] Chest pain   [] Chest pressure   [] Palpitations   [] Shortness of breath when laying flat   [] Shortness of breath at rest   [] Shortness of breath with exertion. Vascular:  [] Pain in legs with walking   [] Pain in legs at rest   [] Pain in legs when laying flat   [] Claudication   [] Pain in feet when walking  [] Pain in feet at rest  [] Pain in feet when laying flat   [x] History of DVT   [x] Phlebitis   [] Swelling in legs   [] Varicose veins   [] Non-healing ulcers Pulmonary:   [] Uses home oxygen   [] Productive cough   [] Hemoptysis   [] Wheeze  [] COPD   [] Asthma Neurologic:  [] Dizziness  [] Blackouts   [] Seizures   [] History of stroke   [] History of TIA  [] Aphasia   [] Temporary  blindness   [] Dysphagia   [] Weakness or numbness in arms   [] Weakness or numbness in legs Musculoskeletal:  [x] Arthritis   [] Joint swelling   [] Joint pain   [] Low back pain Hematologic:  [] Easy bruising  [] Easy bleeding   [] Hypercoagulable state   [] Anemic   Gastrointestinal:  [] Blood in stool   [] Vomiting blood  [] Gastroesophageal reflux/heartburn   [] Abdominal pain Genitourinary:  [] Chronic kidney disease   [] Difficult  urination  [] Frequent urination  [] Burning with urination   [] Hematuria Skin:  [] Rashes   [] Ulcers   [] Wounds Psychological:  [] History of anxiety   []  History of major depression.  Physical Examination  BP 127/76   Pulse 75   Ht 6' (1.829 m)   Wt 237 lb (107.5 kg)   BMI 32.14 kg/m  Gen:  WD/WN, NAD Head: [place]/AT, No temporalis wasting. Ear/Nose/Throat: Hearing grossly intact, nares w/o erythema or drainage Eyes: Conjunctiva clear. Sclera non-icteric Neck: Supple.  Trachea midline Pulmonary:  Good air movement, no use of accessory muscles. Breathing comfortably. Cardiac: RRR, no JVD Vascular:  Vessel Right Left  Radial Palpable Palpable                   Musculoskeletal: M/S 5/5 throughout.  No deformity or atrophy. No significant edema. Neurologic: Sensation grossly intact in extremities.  Symmetrical.  Speech is fluent.  Psychiatric: Judgment intact, Mood & affect appropriate for pt's clinical situation. Dermatologic: No rashes or ulcers noted.  No cellulitis or open wounds.       Labs Recent Results (from the past 2160 hour(s))  CBC     Status: Abnormal   Collection Time: 12/10/19  7:42 PM  Result Value Ref Range   WBC 6.4 4.0 - 10.5 K/uL   RBC 5.92 (H) 4.22 - 5.81 MIL/uL   Hemoglobin 17.5 (H) 13.0 - 17.0 g/dL   HCT 54.3 (H) 39 - 52 %   MCV 91.7 80.0 - 100.0 fL   MCH 29.6 26.0 - 34.0 pg   MCHC 32.2 30.0 - 36.0 g/dL   RDW 14.0 11.5 - 15.5 %   Platelets 132 (L) 150 - 400 K/uL   nRBC 0.0 0.0 - 0.2 %    Comment: Performed at [facility],  [address] [place], [place] [address]  Comprehensive metabolic panel     Status: Abnormal   Collection Time: 12/10/19  7:42 PM  Result Value Ref Range   Sodium 142 135 - 145 mmol/L   Potassium 4.0 3.5 - 5.1 mmol/L   Chloride 103 98 - 111 mmol/L   CO2 28 22 - 32 mmol/L   Glucose, Bld 176 (H) 70 - 99 mg/dL    Comment: Glucose reference range applies only to samples taken after fasting for at least 8 hours.   BUN 16 8 - 23 mg/dL   Creatinine, Ser 1.06 0.61 - 1.24 mg/dL   Calcium 9.1 8.9 - 10.3 mg/dL   Total Protein 7.5 6.5 - 8.1 g/dL   Albumin 4.1 3.5 - 5.0 g/dL   AST 59 (H) 15 - 41 U/L   ALT 49 (H) 0 - 44 U/L   Alkaline Phosphatase 149 (H) 38 - 126 U/L   Total Bilirubin 0.9 0.3 - 1.2 mg/dL   GFR, Estimated >60 >60 mL/min    Comment: (NOTE) Calculated using the CKD-EPI Creatinine Equation (2021)    Anion gap 11 5 - 15    Comment: Performed at [facility], [address], [place],  [address]  Troponin I (High Sensitivity)     Status: Abnormal   Collection Time: 12/10/19  7:42 PM  Result Value Ref Range   Troponin I (High Sensitivity) 30 (H) <18 ng/L    Comment: (NOTE) Elevated high sensitivity troponin I (hsTnI) values and significant  changes across serial measurements may suggest ACS but many other  chronic and acute conditions are known to elevate hsTnI results.  Refer to the "Links" section for  chest pain algorithms and additional  guidance. Performed at [facility], [address] [place], [place] [address]   POC CBG, ED     Status: Abnormal   Collection Time: 12/10/19  7:42 PM  Result Value Ref Range   Glucose-Capillary 190 (H) 70 - 99 mg/dL    Comment: Glucose reference range applies only to samples taken after fasting for at least 8 hours.  Troponin I (High Sensitivity)     Status: Abnormal   Collection Time: 12/10/19 10:19 PM  Result Value Ref Range   Troponin I (High Sensitivity) 39 (H) <18 ng/L    Comment: (NOTE) Elevated high  sensitivity troponin I (hsTnI) values and significant  changes across serial measurements may suggest ACS but many other  chronic and acute conditions are known to elevate hsTnI results.  Refer to the "Links" section for chest pain algorithms and additional  guidance. Performed at [facility], [address] [place], [place] [address]   I-STAT creatinine     Status: Abnormal   Collection Time: 12/18/19  5:50 PM  Result Value Ref Range   Creatinine, Ser 1.30 (H) 0.61 - 1.24 mg/dL  CBC     Status: Abnormal   Collection Time: 12/18/19  6:58 PM  Result Value Ref Range   WBC 7.8 4.0 - 10.5 K/uL   RBC 5.66 4.22 - 5.81 MIL/uL   Hemoglobin 16.5 13.0 - 17.0 g/dL   HCT 52.3 (H) 39 - 52 %   MCV 92.4 80.0 - 100.0 fL   MCH 29.2 26.0 - 34.0 pg   MCHC 31.5 30.0 - 36.0 g/dL   RDW 13.9 11.5 - 15.5 %   Platelets 151 150 - 400 K/uL   nRBC 0.0 0.0 - 0.2 %    Comment: Performed at [facility], [address] [place], [place] [address]  Comprehensive metabolic panel     Status: Abnormal   Collection Time: 12/18/19  6:58 PM  Result Value Ref Range   Sodium 142 135 - 145 mmol/L   Potassium 4.4 3.5 - 5.1 mmol/L    Comment: HEMOLYSIS AT THIS LEVEL MAY AFFECT RESULT   Chloride 105 98 - 111 mmol/L   CO2 26 22 - 32 mmol/L   Glucose, Bld 116 (H) 70 - 99 mg/dL    Comment: Glucose reference range applies only to samples taken after fasting for at least 8 hours.   BUN 18 8 - 23 mg/dL   Creatinine, Ser 1.26 (H) 0.61 - 1.24 mg/dL   Calcium 8.6 (L) 8.9 - 10.3 mg/dL   Total Protein 7.5 6.5 - 8.1 g/dL   Albumin 4.0 3.5 - 5.0 g/dL   AST 55 (H) 15 - 41 U/L   ALT 49 (H) 0 - 44 U/L   Alkaline Phosphatase 180 (H) 38 - 126 U/L   Total Bilirubin 1.3 (H) 0.3 - 1.2 mg/dL   GFR, Estimated >60 >60 mL/min    Comment: (NOTE) Calculated using the CKD-EPI Creatinine Equation (2021)    Anion gap 11 5 - 15    Comment: Performed at [facility], [address] [place],  [address]   Troponin I (High Sensitivity)     Status: Abnormal   Collection Time: 12/18/19  6:58 PM  Result Value Ref Range   Troponin I (High Sensitivity) 33 (H) <18 ng/L    Comment: (NOTE) Elevated high sensitivity troponin I (hsTnI) values and significant  changes across serial measurements may suggest ACS but many other  chronic and acute conditions are known to elevate hsTnI results.  Refer to the "Links" section for chest pain algorithms and additional  guidance. Performed at [facility], [address] [place], [place] [address]   Brain natriuretic peptide     Status: None   Collection Time: 12/18/19  6:58 PM  Result Value Ref Range   B Natriuretic Peptide 36.6 0.0 - 100.0 pg/mL    Comment: Performed at [facility], [address] [place], [place] [address]  Respiratory Panel by RT PCR (Flu A&B, Covid) - Nasopharyngeal Swab     Status: None   Collection Time: 12/18/19  7:12 PM   Specimen: Nasopharyngeal Swab  Result Value Ref Range   SARS Coronavirus 2 by RT PCR NEGATIVE NEGATIVE    Comment: (NOTE) SARS-CoV-2 target nucleic acids are NOT DETECTED.  The SARS-CoV-2 RNA is generally detectable in upper respiratoy specimens during the acute phase of infection. The lowest concentration of SARS-CoV-2 viral copies this assay can detect is 131 copies/mL. A negative result does not preclude SARS-Cov-2 infection and should not be used as the sole basis for treatment or other patient management decisions. A negative result may occur with  improper specimen collection/handling, submission of specimen other than nasopharyngeal swab, presence of viral mutation(s) within the areas targeted by this assay, and inadequate number of viral copies (<131 copies/mL). A negative result must be combined with clinical observations, patient history, and epidemiological information. The expected result is Negative.  Fact Sheet for Patients:   PinkCheek.be  Fact Sheet for Healthcare Providers:  GravelBags.it  This test is no t yet approved or cleared by the [place] FDA and  has been authorized for detection and/or diagnosis of SARS-CoV-2 by FDA under an Emergency Use Authorization (EUA). This EUA will remain  in effect (meaning this test can be used) for the duration of the COVID-19 declaration under Section 564(b)(1) of the Act, 21 U.S.C. section 360bbb-3(b)(1), unless the authorization is terminated or revoked sooner.     Influenza A by PCR NEGATIVE NEGATIVE   Influenza B by PCR NEGATIVE NEGATIVE    Comment: (NOTE) The Xpert Xpress SARS-CoV-2/FLU/RSV assay is intended as an aid in  the diagnosis of influenza from Nasopharyngeal swab specimens and  should not be used as a sole basis for treatment. Nasal washings and  aspirates are unacceptable for Xpert Xpress SARS-CoV-2/FLU/RSV  testing.  Fact Sheet for Patients: PinkCheek.be  Fact Sheet for Healthcare Providers: GravelBags.it  This test is not yet approved or cleared by the [place] FDA and  has been authorized for detection and/or diagnosis of SARS-CoV-2 by  FDA under an Emergency Use Authorization (EUA). This EUA will remain  in effect (meaning this test can be used) for the duration of the  Covid-19 declaration under Section 564(b)(1) of the Act, 21  U.S.C. section 360bbb-3(b)(1), unless the authorization is  terminated or revoked. Performed at [facility], [address] [place], [place] [address]   APTT     Status: None   Collection Time: 12/18/19  7:28 PM  Result Value Ref Range   aPTT 33 24 - 36 seconds    Comment: Performed at [facility], [address] [place],  [address]  Protime-INR     Status: None   Collection Time: 12/18/19  7:28 PM  Result Value Ref Range   Prothrombin Time 13.8 11.4 -  15.2 seconds   INR 1.1 0.8 - 1.2    Comment: (NOTE) INR goal  varies based on device and disease states. Performed at [facility], [place], [place] [address]   Troponin I (High Sensitivity)     Status: Abnormal   Collection Time: 12/18/19 10:03 PM  Result Value Ref Range   Troponin I (High Sensitivity) 46 (H) <18 ng/L    Comment: (NOTE) Elevated high sensitivity troponin I (hsTnI) values and significant  changes across serial measurements may suggest ACS but many other  chronic and acute conditions are known to elevate hsTnI results.  Refer to the "Links" section for chest pain algorithms and additional  guidance. Performed at [facility], [address] [place], [place] [address]   CBG monitoring, ED     Status: Abnormal   Collection Time: 12/18/19 11:52 PM  Result Value Ref Range   Glucose-Capillary 104 (H) 70 - 99 mg/dL    Comment: Glucose reference range applies only to samples taken after fasting for at least 8 hours.  Heparin level (unfractionated)     Status: Abnormal   Collection Time: 12/19/19  4:05 AM  Result Value Ref Range   Heparin Unfractionated 0.91 (H) 0.30 - 0.70 IU/mL    Comment: (NOTE) If heparin results are below expected values, and patient dosage has  been confirmed, suggest follow up testing of antithrombin III levels. Performed at [facility], [address] [place], [place] [address]   CBC     Status: Abnormal   Collection Time: 12/19/19  4:05 AM  Result Value Ref Range   WBC 7.5 4.0 - 10.5 K/uL   RBC 5.33 4.22 - 5.81 MIL/uL   Hemoglobin 15.7 13.0 - 17.0 g/dL   HCT 49.4 39 - 52 %   MCV 92.7 80.0 - 100.0 fL   MCH 29.5 26.0 - 34.0 pg   MCHC 31.8 30.0 - 36.0 g/dL   RDW 13.8 11.5 - 15.5 %   Platelets 149 (L) 150 - 400 K/uL   nRBC 0.0 0.0 - 0.2 %    Comment: Performed at [facility], [address] [place], [place] [address]  Comprehensive metabolic panel     Status: Abnormal    Collection Time: 12/19/19  4:05 AM  Result Value Ref Range   Sodium 141 135 - 145 mmol/L   Potassium 3.5 3.5 - 5.1 mmol/L   Chloride 108 98 - 111 mmol/L   CO2 25 22 - 32 mmol/L   Glucose, Bld 160 (H) 70 - 99 mg/dL    Comment: Glucose reference range applies only to samples taken after fasting for at least 8 hours.   BUN 16 8 - 23 mg/dL   Creatinine, Ser 1.20 0.61 - 1.24 mg/dL   Calcium 8.6 (L) 8.9 - 10.3 mg/dL   Total Protein 6.4 (L) 6.5 - 8.1 g/dL   Albumin 3.5 3.5 - 5.0 g/dL   AST 43 (H) 15 - 41 U/L   ALT 40 0 - 44 U/L   Alkaline Phosphatase 149 (H) 38 - 126 U/L   Total Bilirubin 0.9 0.3 - 1.2 mg/dL   GFR, Estimated >60 >60 mL/min    Comment: (NOTE) Calculated using the CKD-EPI Creatinine Equation (2021)    Anion gap 8 5 - 15    Comment: Performed at [facility], [address] [place], [place] [address]  Hemoglobin A1c     Status: Abnormal   Collection Time: 12/19/19  4:05 AM  Result Value Ref Range   Hgb A1c MFr Bld 6.7 (H) 4.8 - 5.6 %  Comment: (NOTE) Pre diabetes:          5.7%-6.4%  Diabetes:              >6.4%  Glycemic control for   <7.0% adults with diabetes    Mean Plasma Glucose 145.59 mg/dL    Comment: Performed at [facility] [address], [place], [place] [address]  MRSA PCR Screening     Status: None   Collection Time: 12/19/19  4:29 AM   Specimen: Nasopharyngeal  Result Value Ref Range   MRSA by PCR NEGATIVE NEGATIVE    Comment:        The GeneXpert MRSA Assay (FDA approved for NASAL specimens only), is one component of a comprehensive MRSA colonization surveillance program. It is not intended to diagnose MRSA infection nor to guide or monitor treatment for MRSA infections. Performed at [facility], [address] [place], [place] [address]   Glucose, capillary     Status: Abnormal   Collection Time: 12/19/19  7:40 AM  Result Value Ref Range   Glucose-Capillary 108 (H) 70 - 99 mg/dL    Comment: Glucose  reference range applies only to samples taken after fasting for at least 8 hours.  ECHOCARDIOGRAM COMPLETE     Status: None   Collection Time: 12/19/19  9:04 AM  Result Value Ref Range   Weight 3,824 oz   Height 74 in   BP 158/115 mmHg   Ao pk vel 1.22 m/s   AV Area VTI 2.92 cm2   AR max vel 2.50 cm2   AV Mean grad 3.5 mmHg   AV Peak grad 5.9 mmHg   AV Area mean vel 2.63 cm2   Area-P 1/2 2.43 cm2  Glucose, capillary     Status: Abnormal   Collection Time: 12/19/19 11:11 AM  Result Value Ref Range   Glucose-Capillary 108 (H) 70 - 99 mg/dL    Comment: Glucose reference range applies only to samples taken after fasting for at least 8 hours.  Heparin level (unfractionated)     Status: Abnormal   Collection Time: 12/19/19  2:20 PM  Result Value Ref Range   Heparin Unfractionated 1.04 (H) 0.30 - 0.70 IU/mL    Comment: (NOTE) If heparin results are below expected values, and patient dosage has  been confirmed, suggest follow up testing of antithrombin III levels. Performed at [facility], [address] [place], [place] [address]   Glucose, capillary     Status: None   Collection Time: 12/19/19  4:18 PM  Result Value Ref Range   Glucose-Capillary 85 70 - 99 mg/dL    Comment: Glucose reference range applies only to samples taken after fasting for at least 8 hours.  Glucose, capillary     Status: Abnormal   Collection Time: 12/19/19  9:49 PM  Result Value Ref Range   Glucose-Capillary 102 (H) 70 - 99 mg/dL    Comment: Glucose reference range applies only to samples taken after fasting for at least 8 hours.  Heparin level (unfractionated)     Status: None   Collection Time: 12/20/19  1:20 AM  Result Value Ref Range   Heparin Unfractionated 0.60 0.30 - 0.70 IU/mL    Comment: (NOTE) If heparin results are below expected values, and patient dosage has  been confirmed, suggest follow up testing of antithrombin III levels. Performed at [facility], [address], [place], [place] [address]   CBC     Status: None  Collection Time: 12/20/19  4:21 AM  Result Value Ref Range   WBC 6.5 4.0 - 10.5 K/uL   RBC 5.21 4.22 - 5.81 MIL/uL   Hemoglobin 15.4 13.0 - 17.0 g/dL   HCT 48.1 39 - 52 %   MCV 92.3 80.0 - 100.0 fL   MCH 29.6 26.0 - 34.0 pg   MCHC 32.0 30.0 - 36.0 g/dL   RDW 13.7 11.5 - 15.5 %   Platelets 151 150 - 400 K/uL   nRBC 0.0 0.0 - 0.2 %    Comment: Performed at [facility], [address] [place], [place] [address]  Basic metabolic panel     Status: Abnormal   Collection Time: 12/20/19  4:21 AM  Result Value Ref Range   Sodium 137 135 - 145 mmol/L   Potassium 4.4 3.5 - 5.1 mmol/L   Chloride 104 98 - 111 mmol/L   CO2 24 22 - 32 mmol/L   Glucose, Bld 111 (H) 70 - 99 mg/dL    Comment: Glucose reference range applies only to samples taken after fasting for at least 8 hours.   BUN 16 8 - 23 mg/dL   Creatinine, Ser 1.31 (H) 0.61 - 1.24 mg/dL   Calcium 8.6 (L) 8.9 - 10.3 mg/dL   GFR, Estimated 57 (L) >60 mL/min    Comment: (NOTE) Calculated using the CKD-EPI Creatinine Equation (2021)    Anion gap 9 5 - 15    Comment: Performed at [facility], [address], [place], [place] [address]  Magnesium     Status: None   Collection Time: 12/20/19  4:21 AM  Result Value Ref Range   Magnesium 1.9 1.7 - 2.4 mg/dL    Comment: Performed at [facility], [address] [place], [place] [address]  Glucose, capillary     Status: None   Collection Time: 12/20/19  7:35 AM  Result Value Ref Range   Glucose-Capillary 94 70 - 99 mg/dL    Comment: Glucose reference range applies only to samples taken after fasting for at least 8 hours.  Heparin level (unfractionated)     Status: Abnormal   Collection Time: 12/20/19 10:27 AM  Result Value Ref Range   Heparin Unfractionated 0.73 (H) 0.30 - 0.70 IU/mL    Comment: (NOTE) If heparin results are below expected values, and patient dosage has  been confirmed, suggest  follow up testing of antithrombin III levels. Performed at [facility], [address] [place], [place] [address]   Glucose, capillary     Status: None   Collection Time: 12/20/19 11:45 AM  Result Value Ref Range   Glucose-Capillary 93 70 - 99 mg/dL    Comment: Glucose reference range applies only to samples taken after fasting for at least 8 hours.    Radiology CT Head Wo Contrast  Result Date: 12/10/2019 CLINICAL DATA:  Acute onset dizziness and nausea EXAM: CT HEAD WITHOUT CONTRAST TECHNIQUE: Contiguous axial images were obtained from the base of the skull through the vertex without intravenous contrast. COMPARISON:  CT 01/09/2019, MRI 09/05/2018 FINDINGS: Brain: Redemonstration of a region of encephalomalacia in the right parietal lobe, unchanged from comparison imaging. No evidence of acute infarction, hemorrhage, hydrocephalus, extra-axial collection, visible mass lesion or mass effect. Symmetric prominence of the ventricles, cisterns and sulci compatible with parenchymal volume loss. Patchy areas of white matter hypoattenuation are most compatible with chronic microvascular angiopathy. Vascular: Atherosclerotic calcification of the carotid siphons and intradural vertebral arteries. No hyperdense vessel. Skull:  No calvarial fracture or suspicious osseous lesion. No scalp swelling or hematoma. Sinuses/Orbits: Paranasal sinuses and mastoid air cells are predominantly clear with evidence of prior sinus surgery including bilateral ethmoidectomy and maxillary antrostomy. Included orbital structures are unremarkable. Other: None. IMPRESSION: 1. No acute intracranial abnormality. 2. 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 3. 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 4. Prior sinus surgery. Electronically Signed   By: Lovena Le M.D.   On: 12/10/2019 20:05   CT ANGIO CHEST PE W OR WO CONTRAST  Result Date: 12/18/2019 CLINICAL DATA:   Shortness of breath and elevated D-dimer. EXAM: CT ANGIOGRAPHY CHEST WITH CONTRAST TECHNIQUE: Multidetector CT imaging of the chest was performed using the standard protocol during bolus administration of intravenous contrast. Multiplanar CT image reconstructions and MIPs were obtained to evaluate the vascular anatomy. CONTRAST:  44mL OMNIPAQUE IOHEXOL 350 MG/ML SOLN COMPARISON:  Report from radiograph earlier today. Images not available. FINDINGS: Cardiovascular: Positive for acute pulmonary embolus with filling defect involving the distal main pulmonary artery extending into the upper and lower lobar branches. Lesser pulmonary emboli on the right. Segmental filling defect in the right upper lobe, series 4, image 44. There is subsegmental filling defects in the right lower lobe, series 4 images 59 and 71. There is slight straightening of the intraventricular septum and right heart strain with RV to LV ratio of 1.1. Mild cardiomegaly. Mild aortic atherosclerosis without aortic dissection. Occasional coronary artery calcifications. Trace pericardial effusion. Mediastinum/Nodes: Scattered small mediastinal lymph nodes are not enlarged by size criteria. There is no hilar adenopathy. Patulous esophagus without wall thickening. No visualized thyroid nodule. Lungs/Pleura: There are scattered nodular densities within the left lower lobe and right middle lobe, all measuring 9-10 mm with indistinct margins and slight surrounding ground-glass. Trace left pleural effusion. Linear atelectasis in the lingula. No findings of pulmonary edema. Upper Abdomen: Nodular hepatic contours suspicious for cirrhosis. No acute upper abdominal findings. Musculoskeletal: There is thin flowing syndesmophytes throughout the thoracic spine with slight spurring of the vertebral bodies typical of ankylosing spondylitis. There is also fusion of the spinous processes. No fracture or acute osseous abnormalities. Review of the MIP images confirms the  above findings. IMPRESSION: 1. Positive for acute pulmonary embolus with moderate clot burden and evidence of right heart strain, RV to LV ratio of 1.1. Greatest thromboembolic burden is on the left and involves the distal main left pulmonary artery. 2. Scattered nodular densities within the left lower lobe and right middle lobe, all measuring 9-10 mm with indistinct margins and slight surrounding ground-glass. Findings may be infectious or inflammatory, however recommend follow-up chest CT in 3 months to ensure resolution. Possibility of septic emboli is raised. 3. Trace left pleural effusion. 4. 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 5. Ankylosing spondylitis. Aortic Atherosclerosis (ICD10-I70.0). Critical Value/emergent results were called by telephone at the time of interpretation on 12/18/2019 at 6:14 pm to Dr Dion Body , who verbally acknowledged these results. The patient was asked remain in the CT department by technologists, and referred directly to the emergency room for treatment. Electronically Signed   By: Keith Rake M.D.   On: 12/18/2019 18:15   MR BRAIN WO CONTRAST  Result Date: 12/11/2019 CLINICAL DATA:  Acute onset of dizziness and nausea/vomiting. EXAM: MRI HEAD WITHOUT CONTRAST TECHNIQUE: Multiplanar, multiecho pulse sequences of the brain and surrounding structures were obtained without intravenous contrast. COMPARISON:  CT head 12/10/2019, MRI head 09/05/2018 FINDINGS: Brain: No acute infarction,  hemorrhage, hydrocephalus, extra-axial collection or mass lesion. Similar encephalomalacia associated with prior right parietal lobe infarct. Similar susceptibility artifact associated with this infarct, compatible with prior hemorrhage. Vascular: Major arterial flow voids are maintained at the skull base. Skull and upper cervical spine: Normal marrow signal. Sinuses/Orbits: Mild scattered mucosal thickening. No air-fluid levels. Unremarkable orbits. IMPRESSION:  1. No evidence of acute intracranial abnormality. Specifically, no acute infarct. 2. Remote right parietal infarct. Electronically Signed   By: Margaretha Sheffield MD   On: 12/11/2019 08:00   ECHOCARDIOGRAM COMPLETE  Result Date: 12/19/2019    ECHOCARDIOGRAM REPORT   Patient Name:   Jason Doyle Date of Exam: 12/19/2019 Medical Rec #:  [id number]      Height:       74.0 in Accession #:    [id number]     Weight:       239.0 lb Date of Birth:  04-Nov-1945      BSA:          2.344 m Patient Age:    20 years       BP:           171/98 mmHg Patient Gender: M              HR:           54 bpm. Exam Location:  [facility] Procedure: 2D Echo, Cardiac Doppler and Color Doppler Indications:     Pulmonary Embolus 415.19  History:         Patient has prior history of Echocardiogram examinations, most                  recent 08/21/2018. Risk Factors:Hypertension and Diabetes.  Sonographer:     Sherrie Sport RDCS (AE) Referring Phys:  [place] Diagnosing Phys: Isaias Cowman MD IMPRESSIONS  1. Left ventricular ejection fraction, by estimation, is 60 to 65%. The left ventricle has normal function. The left ventricle has no regional wall motion abnormalities. Left ventricular diastolic parameters are consistent with Grade I diastolic dysfunction (impaired relaxation).  2. Right ventricular systolic function is normal. The right ventricular size is normal.  3. The mitral valve is normal in structure. Trivial mitral valve regurgitation. No evidence of mitral stenosis.  4. The aortic valve is normal in structure. Aortic valve regurgitation is not visualized. No aortic stenosis is present.  5. The inferior vena cava is normal in size with greater than 50% respiratory variability, suggesting right atrial pressure of 3 mmHg. FINDINGS  Left Ventricle: Left ventricular ejection fraction, by estimation, is 60 to 65%. The left ventricle has normal function. The left ventricle has no regional wall motion abnormalities. The left  ventricular internal cavity size was normal in size. There is  no left ventricular hypertrophy. Left ventricular diastolic parameters are consistent with Grade I diastolic dysfunction (impaired relaxation). Right Ventricle: The right ventricular size is normal. No increase in right ventricular wall thickness. Right ventricular systolic function is normal. Left Atrium: Left atrial size was normal in size. Right Atrium: Right atrial size was normal in size. Pericardium: There is no evidence of pericardial effusion. Mitral Valve: The mitral valve is normal in structure. Trivial mitral valve regurgitation. No evidence of mitral valve stenosis. Tricuspid Valve: The tricuspid valve is normal in structure. Tricuspid valve regurgitation is trivial. No evidence of tricuspid stenosis. Aortic Valve: The aortic valve is normal in structure. Aortic valve regurgitation is not visualized. No aortic stenosis is present. Aortic valve mean gradient measures 3.5 mmHg.  Aortic valve peak gradient measures 5.9 mmHg. Aortic valve area, by VTI measures 2.92 cm. Pulmonic Valve: The pulmonic valve was normal in structure. Pulmonic valve regurgitation is not visualized. No evidence of pulmonic stenosis. Aorta: The aortic root is normal in size and structure. Venous: The inferior vena cava is normal in size with greater than 50% respiratory variability, suggesting right atrial pressure of 3 mmHg. IAS/Shunts: No atrial level shunt detected by color flow Doppler.  LEFT VENTRICLE PLAX 2D LVIDd:         4.68 cm  Diastology LV PW:         1.05 cm  LV e' medial:    4.68 cm/s LV IVS:        1.06 cm  LV E/e' medial:  12.7 LVOT diam:     2.00 cm  LV e' lateral:   6.09 cm/s LV SV:         72       LV E/e' lateral: 9.8 LV SV Index:   31 LVOT Area:     3.14 cm  RIGHT VENTRICLE RV Basal diam:  2.96 cm RV S prime:     13.70 cm/s TAPSE (M-mode): 3.9 cm LEFT ATRIUM             Index       RIGHT ATRIUM           Index LA diam:        3.30 cm 1.41 cm/m  RA  Area:     20.10 cm LA Vol (A2C):   66.3 ml 28.28 ml/m RA Volume:   53.70 ml  22.91 ml/m LA Vol (A4C):   54.8 ml 23.37 ml/m LA Biplane Vol: 60.2 ml 25.68 ml/m  AORTIC VALVE                   PULMONIC VALVE AV Area (Vmax):    2.50 cm    PV Vmax:        0.92 m/s AV Area (Vmean):   2.63 cm    PV Peak grad:   3.4 mmHg AV Area (VTI):     2.92 cm    RVOT Peak grad: 4 mmHg AV Vmax:           121.50 cm/s AV Vmean:          86.600 cm/s AV VTI:            0.246 m AV Peak Grad:      5.9 mmHg AV Mean Grad:      3.5 mmHg LVOT Vmax:         96.60 cm/s LVOT Vmean:        72.600 cm/s LVOT VTI:          0.229 m LVOT/AV VTI ratio: 0.93 MITRAL VALVE                TRICUSPID VALVE MV Area (PHT): 2.43 cm     TR Peak grad:   15.1 mmHg MV Decel Time: 312 msec     TR Vmax:        194.00 cm/s MV E velocity: 59.60 cm/s MV A velocity: 101.00 cm/s  SHUNTS MV E/A ratio:  0.59         Systemic VTI:  0.23 m                             Systemic Diam: 2.00 cm Isaias Cowman MD Electronically signed  by Isaias Cowman MD Signature Date/Time: 12/19/2019/12:51:48 PM    Final     Assessment/Plan  Pulmonary embolism [facility]) The patient is tolerating anticoagulation well and doing well symptomatically.  No role for intervention in his situation.  He will neatly 6 months of full anticoagulation.  I can see him back at that time to discuss options for ongoing anticoagulation therapy which would include changing to aspirin, using a lower dose of anticoagulation, or staying on full dose anticoagulation.  Benign essential hypertension blood pressure control important in reducing the progression of atherosclerotic disease. On appropriate oral medications.   Type 2 diabetes mellitus with neurologic complication, without long-term current use of insulin (HCC) blood glucose control important in reducing the progression of atherosclerotic disease. Also, involved in wound healing. On appropriate medications.     Leotis Pain,  MD  12/27/2019 11:32 AM    This note was created with Dragon medical transcription system.  Any errors from dictation are purely unintentional

## 2019-12-27 NOTE — Assessment & Plan Note (Signed)
blood pressure control important in reducing the progression of atherosclerotic disease. On appropriate oral medications.  

## 2019-12-27 NOTE — Patient Instructions (Signed)
Pulmonary Embolism  A pulmonary embolism (PE) is a sudden blockage or decrease of blood flow in one or both lungs. Most blockages come from a blood clot that forms in the vein of a lower leg, thigh, or arm (deep vein thrombosis, DVT) and travels to the lungs. A clot is blood that has thickened into a gel or solid. PE is a dangerous and life-threatening condition that needs to be treated right away. What are the causes? This condition is usually caused by a blood clot that forms in a vein and moves to the lungs. In rare cases, it may be caused by air, fat, part of a tumor, or other tissue that moves through the veins and into the lungs. What increases the risk? The following factors may make you more likely to develop this condition:  Experiencing a traumatic injury, such as breaking a hip or leg.  Having: ? A spinal cord injury. ? Orthopedic surgery, especially hip or knee replacement. ? Any major surgery. ? A stroke. ? DVT. ? Blood clots or blood clotting disease. ? Long-term (chronic) lung or heart disease. ? Cancer treated with chemotherapy. ? A central venous catheter.  Taking medicines that contain estrogen. These include birth control pills and hormone replacement therapy.  Being: ? Pregnant. ? In the period of time after your baby is delivered (postpartum). ? Older than age 60. ? Overweight. ? A smoker, especially if you have other risks. What are the signs or symptoms? Symptoms of this condition usually start suddenly and include:  Shortness of breath during activity or at rest.  Coughing, coughing up blood, or coughing up blood-tinged mucus.  Chest pain that is often worse with deep breaths.  Rapid or irregular heartbeat.  Feeling light-headed or dizzy.  Fainting.  Feeling anxious.  Fever.  Sweating.  Pain and swelling in a leg. This is a symptom of DVT, which can lead to PE. How is this diagnosed? This condition may be diagnosed based on:  Your medical  history.  A physical exam.  Blood tests.  CT pulmonary angiogram. This test checks blood flow in and around your lungs.  Ventilation-perfusion scan, also called a lung VQ scan. This test measures air flow and blood flow to the lungs.  An ultrasound of the legs. How is this treated? Treatment for this condition depends on many factors, such as the cause of your PE, your risk for bleeding or developing more clots, and other medical conditions you have. Treatment aims to remove, dissolve, or stop blood clots from forming or growing larger. Treatment may include:  Medicines, such as: ? Blood thinning medicines (anticoagulants) to stop clots from forming. ? Medicines that dissolve clots (thrombolytics).  Procedures, such as: ? Using a flexible tube to remove a blood clot (embolectomy) or to deliver medicine to destroy it (catheter-directed thrombolysis). ? Inserting a filter into a large vein that carries blood to the heart (inferior vena cava). This filter (vena cava filter) catches blood clots before they reach the lungs. ? Surgery to remove the clot (surgical embolectomy). This is rare. You may need a combination of immediate, long-term (up to 3 months after diagnosis), and extended (more than 3 months after diagnosis) treatments. Your treatment may continue for several months (maintenance therapy). You and your health care provider will work together to choose the treatment program that is best for you. Follow these instructions at home: Medicines  Take over-the-counter and prescription medicines only as told by your health care provider.  If you   are taking an anticoagulant medicine: ? Take the medicine every day at the same time each day. ? Understand what foods and drugs interact with your medicine. ? Understand the side effects of this medicine, including excessive bruising or bleeding. Ask your health care provider or pharmacist about other side effects. General  instructions  Wear a medical alert bracelet or carry a medical alert card that says you have had a PE and lists what medicines you take.  Ask your health care provider when you may return to your normal activities. Avoid sitting or lying for a long time without moving.  Maintain a healthy weight. Ask your health care provider what weight is healthy for you.  Do not use any products that contain nicotine or tobacco, such as cigarettes, e-cigarettes, and chewing tobacco. If you need help quitting, ask your health care provider.  Talk with your health care provider about any travel plans. It is important to make sure that you are still able to take your medicine while on trips.  Keep all follow-up visits as told by your health care provider. This is important. Contact a health care provider if:  You missed a dose of your blood thinner medicine. Get help right away if:  You have: ? New or increased pain, swelling, warmth, or redness in an arm or leg. ? Numbness or tingling in an arm or leg. ? Shortness of breath during activity or at rest. ? A fever. ? Chest pain. ? A rapid or irregular heartbeat. ? A severe headache. ? Vision changes. ? A serious fall or accident, or you hit your head. ? Stomach (abdominal) pain. ? Blood in your vomit, stool, or urine. ? A cut that will not stop bleeding.  You cough up blood.  You feel light-headed or dizzy.  You cannot move your arms or legs.  You are confused or have memory loss. These symptoms may represent a serious problem that is an emergency. Do not wait to see if the symptoms will go away. Get medical help right away. Call your local emergency services (911 in the [place] Do not drive yourself to the hospital. Summary  A pulmonary embolism (PE) is a sudden blockage or decrease of blood flow in one or both lungs. PE is a dangerous and life-threatening condition that needs to be treated right away.  Treatments for this condition usually  include medicines to thin your blood (anticoagulants) or medicines to break apart blood clots (thrombolytics).  If you are given blood thinners, it is important to take the medicine every day at the same time each day.  Understand what foods and drugs interact with any medicines that you are taking.  If you have signs of PE or DVT, call your local emergency services (911 in the [place] This information is not intended to replace advice given to you by your health care provider. Make sure you discuss any questions you have with your health care provider. Document Revised: 11/08/2017 Document Reviewed: 11/08/2017 Elsevier Patient Education  2020 [employer].

## 2020-05-21 ENCOUNTER — Other Ambulatory Visit: Payer: Self-pay | Admitting: Specialist

## 2020-05-21 DIAGNOSIS — R918 Other nonspecific abnormal finding of lung field: Secondary | ICD-10-CM

## 2020-06-02 ENCOUNTER — Other Ambulatory Visit: Payer: Self-pay

## 2020-06-02 ENCOUNTER — Ambulatory Visit: Payer: Medicare Other

## 2020-06-02 ENCOUNTER — Ambulatory Visit
Admission: RE | Admit: 2020-06-02 | Discharge: 2020-06-02 | Disposition: A | Discharge status: Home / Self Care | Payer: Medicare Other | Source: Ambulatory Visit | Attending: Specialist | Admitting: Specialist

## 2020-06-02 DIAGNOSIS — R918 Other nonspecific abnormal finding of lung field: Secondary | ICD-10-CM | POA: Insufficient documentation

## 2020-06-11 ENCOUNTER — Ambulatory Visit: Payer: Medicare Other

## 2020-06-19 ENCOUNTER — Ambulatory Visit (INDEPENDENT_AMBULATORY_CARE_PROVIDER_SITE_OTHER): Payer: TRICARE For Life (TFL) | Admitting: Vascular Surgery

## 2020-06-26 ENCOUNTER — Other Ambulatory Visit: Payer: Self-pay

## 2020-06-26 ENCOUNTER — Ambulatory Visit (INDEPENDENT_AMBULATORY_CARE_PROVIDER_SITE_OTHER): Payer: Medicare Other | Admitting: Vascular Surgery

## 2020-06-26 VITALS — BP 143/75 | HR 56 | Ht 74.0 in | Wt 243.0 lb

## 2020-06-26 DIAGNOSIS — E1140 Type 2 diabetes mellitus with diabetic neuropathy, unspecified: Secondary | ICD-10-CM

## 2020-06-26 DIAGNOSIS — Z860100 Personal history of colon polyps, unspecified: Secondary | ICD-10-CM | POA: Insufficient documentation

## 2020-06-26 DIAGNOSIS — G629 Polyneuropathy, unspecified: Secondary | ICD-10-CM | POA: Insufficient documentation

## 2020-06-26 DIAGNOSIS — I2694 Multiple subsegmental pulmonary emboli without acute cor pulmonale: Secondary | ICD-10-CM | POA: Diagnosis not present

## 2020-06-26 DIAGNOSIS — I10 Essential (primary) hypertension: Secondary | ICD-10-CM | POA: Diagnosis not present

## 2020-06-26 DIAGNOSIS — N400 Enlarged prostate without lower urinary tract symptoms: Secondary | ICD-10-CM | POA: Insufficient documentation

## 2020-06-26 DIAGNOSIS — R001 Bradycardia, unspecified: Secondary | ICD-10-CM | POA: Insufficient documentation

## 2020-06-26 DIAGNOSIS — R3911 Hesitancy of micturition: Secondary | ICD-10-CM | POA: Insufficient documentation

## 2020-06-26 DIAGNOSIS — Z23 Encounter for immunization: Secondary | ICD-10-CM | POA: Insufficient documentation

## 2020-06-26 DIAGNOSIS — J0191 Acute recurrent sinusitis, unspecified: Secondary | ICD-10-CM | POA: Insufficient documentation

## 2020-06-26 DIAGNOSIS — R350 Frequency of micturition: Secondary | ICD-10-CM | POA: Insufficient documentation

## 2020-06-26 DIAGNOSIS — Z461 Encounter for fitting and adjustment of hearing aid: Secondary | ICD-10-CM | POA: Insufficient documentation

## 2020-06-26 DIAGNOSIS — H903 Sensorineural hearing loss, bilateral: Secondary | ICD-10-CM | POA: Insufficient documentation

## 2020-06-26 NOTE — Assessment & Plan Note (Signed)
Patient is doing well and is received full anticoagulation for 6 months for his pulmonary embolus.  We discussed 3 options going forward.  He can stay on full anticoagulation, stop anticoagulation altogether, or do a third option which has promising preliminary data.  This would be to cut his Eliquis dose to 2.5 mg.  We will continue this indefinitely and this is shown to reduce the risk of recurrent thrombosis with bleeding risk similar to aspirin.  He is going to see his cardiologist next week and if his cardiologist is in agreement, we will go to 2.5 mg of Eliquis twice daily and continue this indefinitely.  I will see him back in 6 months.

## 2020-06-26 NOTE — Progress Notes (Signed)
MRN : [id number]  Jason Doyle is a 75 y.o. ([birth year]) male who presents with chief complaint of  Chief Complaint  Patient presents with  . Follow-up    6 Mo No studies  .  History of Present Illness: Patient returns today in follow up of his PE. He has been getting anticoagulation for 6 months now.  He is tolerating this well.  He has no complaints with the Eliquis 5 mg twice daily.  He is walking 2 to 3 [place] a day.  He has no chest pain or shortness of breath.  Current Outpatient Medications  Medication Sig Dispense Refill  . allopurinol (ZYLOPRIM) 100 MG tablet Take 100 mg by mouth daily. am    . amLODipine (NORVASC) 5 MG tablet Take 5 mg by mouth daily at 6 (six) AM.    . apixaban (ELIQUIS) 5 MG TABS tablet Take 2 tablets (10mg ) twice daily for 7 days, then 1 tablet (5mg ) twice daily 60 tablet 0  . aspirin 81 MG chewable tablet Chew 1 tablet (81 mg total) by mouth daily. Start on 08/31/2018 no earlier then that please thank you 120 tablet 0  . atorvastatin (LIPITOR) 80 MG tablet Take 40 mg by mouth every evening.     . Azelastine-Fluticasone 137-50 MCG/ACT SUSP Place 1 spray into the nose 2 (two) times daily as needed (congestion).     . budesonide (PULMICORT) 0.25 MG/2ML nebulizer solution ADD 10ML (ONE DOSE) OF MEDICATION TO 250ML OF SALINE IN SINUS RINSE BOTTLE. IRRIGATE SINUSES WITH 120ML THROUGH EACH NOSTRIL TWICE DAILY    . budesonide-formoterol (SYMBICORT) 160-4.5 MCG/ACT inhaler Inhale 2 puffs into the lungs 2 (two) times daily.    Scarlette Shorts SURECLICK 50 MG/ML injection Inject 50 mg into the skin every Wednesday.    . fluticasone (FLONASE) 50 MCG/ACT nasal spray Place into the nose.    Marland Kitchen glipiZIDE (GLUCOTROL XL) 2.5 MG 24 hr tablet Take 2.5 mg by mouth daily with breakfast.    . JARDIANCE 25 MG TABS tablet Take 25 mg by mouth daily at 6 (six) AM.    . LACTASE ENZYME 3000 units tablet Take 3,000 Units by mouth daily at 6 (six) AM.    . lamoTRIgine (LAMICTAL) 100 MG tablet  Take 1 tablet by mouth 2 (two) times daily.    Marland Kitchen lisinopril (PRINIVIL,ZESTRIL) 40 MG tablet Take 40 mg by mouth daily.    Marland Kitchen loratadine (CLARITIN) 10 MG tablet Take 10 mg by mouth daily.    . meclizine (ANTIVERT) 25 MG tablet Take 1 tablet (25 mg total) by mouth 3 (three) times daily as needed for dizziness. 30 tablet 0  . metaxalone (SKELAXIN) 800 MG tablet Take 800 mg by mouth 3 (three) times daily.    . montelukast (SINGULAIR) 10 MG tablet Take 10 mg by mouth at bedtime.    Marland Kitchen oxybutynin (DITROPAN) 5 MG tablet Take 5 mg by mouth 2 (two) times daily.    Marland Kitchen oxymetazoline (AFRIN) 0.05 % nasal spray Place 2 sprays into both nostrils 2 (two) times daily as needed (nasal congestion). 15 mL 2  . tamsulosin (FLOMAX) 0.4 MG CAPS capsule Take 0.4 mg by mouth daily after breakfast.    . tiZANidine (ZANAFLEX) 4 MG tablet Take 4 mg by mouth at bedtime.    Grant Ruts INHUB 250-50 MCG/DOSE AEPB Inhale 1 puff into the lungs 2 (two) times daily.     No current facility-administered medications for this visit.    Past Medical History:  Diagnosis Date  . Ankylosing spondylitis ([place])   . Arthritis    knee,hands,back and neck  . Asthma    LAST ATTACK 4-5 YRS AGO  . BPH (benign prostatic hyperplasia)   . 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 Diabetes mellitus without complication ([place])    TYPE 2  . Gout   . H/O multiple allergies    SEASONAL,ENVIRONMENTAL  . H/O sinus surgery    x4  . Headache    sinus  . Hypertension   . Pulmonary embolism ([place]) 12/18/2019  . Rotator cuff tear    left shoulder  . Sleep apnea    C-PAP    Past Surgical History:  Procedure Laterality Date  . BICEPT TENODESIS Left 07/23/2014   Procedure: BICEPS TENODESIS;  Surgeon: [name], MD;  Location: [place];  Service: Orthopedics;  Laterality: Left;  . COLONOSCOPY WITH PROPOFOL N/A 04/10/2017   Procedure: COLONOSCOPY WITH PROPOFOL;  Surgeon: [name], MD;  Location: [facility]  ENDOSCOPY;  Service: Endoscopy;  Laterality: N/A;  . KNEE ARTHROSCOPY    . LOOP RECORDER INSERTION N/A 11/20/2018   Procedure: LOOP RECORDER INSERTION;  Surgeon: Isaias Cowman, MD;  Location: [place] CV LAB;  Service: Cardiovascular;  Laterality: N/A;  . NASAL SINUS SURGERY     x 4  . SHOULDER ARTHROSCOPY WITH ROTATOR CUFF REPAIR AND SUBACROMIAL DECOMPRESSION Left 07/23/2014   Procedure: SHOULDER ARTHROSCOPY WITH ROTATOR CUFF REPAIR AND SUBACROMIAL DECOMPRESSION;  Surgeon: [name], MD;  Location: [place];  Service: Orthopedics;  Laterality: Left;  . UVULOPALATOPHARYNGOPLASTY       Social History        Tobacco Use  . Smoking status: Never Smoker  . Smokeless tobacco: Never Used  Vaping Use  . Vaping Use: Never used  Substance Use Topics  . Alcohol use: Not Currently    Comment: RARELY "1/2 glass wine a year"  . Drug use: Not Currently    Family History No bleeding or clotting disorders      Allergies  Allergen Reactions  . Metformin Diarrhea     REVIEW OF SYSTEMS (Negative unless checked)  Constitutional: [] ?Weight loss  [] ?Fever  [] ?Chills Cardiac: [] ?Chest pain   [] ?Chest pressure   [] ?Palpitations   [] ?Shortness of breath when laying flat   [] ?Shortness of breath at rest   [] ?Shortness of breath with exertion. Vascular:  [] ?Pain in legs with walking   [] ?Pain in legs at rest   [] ?Pain in legs when laying flat   [] ?Claudication   [] ?Pain in feet when walking  [] ?Pain in feet at rest  [] ?Pain in feet when laying flat   [x] ?History of DVT   [x] ?Phlebitis   [] ?Swelling in legs   [] ?Varicose veins   [] ?Non-healing ulcers Pulmonary:   [] ?Uses home oxygen   [] ?Productive cough   [] ?Hemoptysis   [] ?Wheeze  [] ?COPD   [] ?Asthma Neurologic:  [] ?Dizziness  [] ?Blackouts   [] ?Seizures   [] ?History of stroke   [] ?History of TIA  [] ?Aphasia   [] ?Temporary blindness   [] ?Dysphagia   [] ?Weakness or numbness in arms   [] ?Weakness or numbness in  legs Musculoskeletal:  [x] ?Arthritis   [] ?Joint swelling   [] ?Joint pain   [] ?Low back pain Hematologic:  [] ?Easy bruising  [] ?Easy bleeding   [] ?Hypercoagulable state   [] ?Anemic   Gastrointestinal:  [] ?Blood in stool   [] ?Vomiting blood  [] ?Gastroesophageal reflux/heartburn   [] ?Abdominal pain Genitourinary:  [] ?Chronic kidney disease   [] ?Difficult urination  [] ?  Frequent urination  [] ?Burning with urination   [] ?Hematuria Skin:  [] ?Rashes   [] ?Ulcers   [] ?Wounds Psychological:  [] ?History of anxiety   [] ? History of major depression.    Physical Examination  BP (!) 143/75   Pulse (!) 56   Ht 6\' 2"  (1.88 m)   Wt 243 lb (110.2 kg)   BMI 31.20 kg/m  Gen:  WD/WN, NAD.  Appears younger than stated age Head: [place]/AT, No temporalis wasting. Ear/Nose/Throat: Hearing grossly intact, nares w/o erythema or drainage Eyes: Conjunctiva clear. Sclera non-icteric Neck: Supple.  Trachea midline Pulmonary:  Good air movement, no use of accessory muscles.  Cardiac: RRR, no JVD Vascular:  Vessel Right Left  Radial Palpable Palpable                          PT Palpable Palpable  DP Palpable Palpable   Gastrointestinal: soft, non-tender/non-distended. No guarding/reflex.  Musculoskeletal: M/S 5/5 throughout.  No deformity or atrophy.  No edema. Neurologic: Sensation grossly intact in extremities.  Symmetrical.  Speech is fluent.  Psychiatric: Judgment intact, Mood & affect appropriate for pt's clinical situation. Dermatologic: No rashes or ulcers noted.  No cellulitis or open wounds.       Labs No results found for this or any previous visit (from the past 2160 hour(s)).  Radiology CT CHEST WO CONTRAST  Result Date: 06/02/2020 CLINICAL DATA:  Follow-up lung nodules. EXAM: CT CHEST WITHOUT CONTRAST TECHNIQUE: Multidetector CT imaging of the chest was performed following the standard protocol without IV contrast. COMPARISON:  12/18/2019 FINDINGS: Cardiovascular: Mild cardiac  enlargement. No pericardial effusion. Aortic atherosclerosis. Increase caliber of the main pulmonary artery is identified measuring 3.9 cm compatible with PA hypertension. Coronary artery calcifications noted. Mediastinum/Nodes: No enlarged mediastinal or axillary lymph nodes. Thyroid gland, trachea, and esophagus demonstrate no significant findings. Lungs/Pleura: Chronic scar with volume loss noted in the posterolateral lingula. No pleural effusion, airspace consolidation or pneumothorax. Resolution of previous nodular densities within the left lower lobe and right middle lobe compatible with an inflammatory or infectious process. No suspicious lung nodules identified at this time. Upper Abdomen: Nodular contour of the liver suspicious for cirrhosis. No acute abnormality identified within the imaged portions of the upper abdomen. Musculoskeletal: Thin flowing ventral syndesmophytes are noted throughout the thoracic spine consistent with ankylosing spondylitis. No acute or suspicious findings. IMPRESSION: 1. Interval resolution of previous nodular densities in the left lower lobe and right middle lobe consistent with inflammatory or infectious process. 2. No new findings. 3. Morphologic features of liver compatible with cirrhosis. 4. Ankylosing spondylitis. 5.  Coronary artery calcifications. Electronically Signed   By: Kerby Moors M.D.   On: 06/02/2020 16:18    Assessment/Plan  Benign essential hypertension blood pressure control important in reducing the progression of atherosclerotic disease. On appropriate oral medications.   Type 2 diabetes mellitus with neurologic complication, without long-term current use of insulin (HCC) blood glucose control important in reducing the progression of atherosclerotic disease. Also, involved in wound healing. On appropriate medications.  Pulmonary embolism [facility]) Patient is doing well and is received full anticoagulation for 6 months for his pulmonary  embolus.  We discussed 3 options going forward.  He can stay on full anticoagulation, stop anticoagulation altogether, or do a third option which has promising preliminary data.  This would be to cut his Eliquis dose to 2.5 mg.  We will continue this indefinitely and this is shown to reduce the risk of recurrent thrombosis  with bleeding risk similar to aspirin.  He is going to see his cardiologist next week and if his cardiologist is in agreement, we will go to 2.5 mg of Eliquis twice daily and continue this indefinitely.  I will see him back in 6 months.    Leotis Pain, MD  06/26/2020 11:25 AM    This note was created with Dragon medical transcription system.  Any errors from dictation are purely unintentional

## 2020-09-29 IMAGING — MR MRI HEAD WITHOUT CONTRAST
12 series · 43 of 48 positions shown · non-contrast
Comparison: 02/04/2016

CLINICAL DATA: Altered mental status. Disorientation. Symptoms
began 3 days ago.

EXAM:
MRI HEAD WITHOUT CONTRAST
TECHNIQUE: Multiplanar, multiecho pulse sequences of the brain and surrounding
structures were obtained without intravenous contrast.

[Series 5: ax dwi_tracew · axial · 3.0mm · 0.60mm/px · z∈[-56,+111]mm · 4 of 57 slices shown]
[im 1/57]
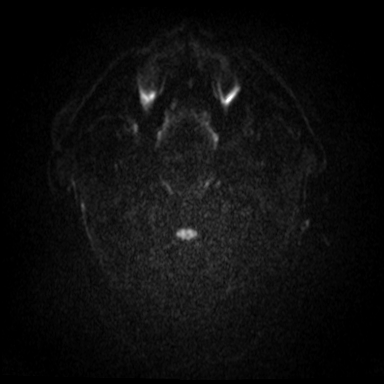
[im 19/57]
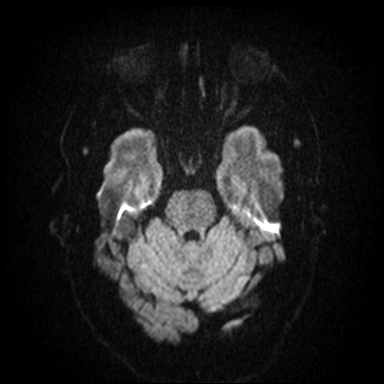
[im 38/57]
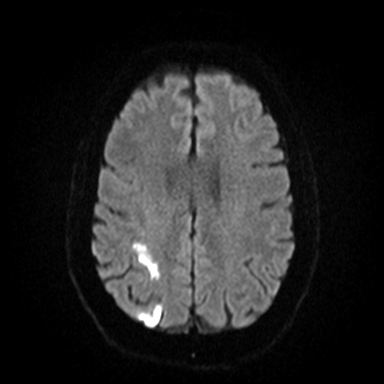
[im 57/57]
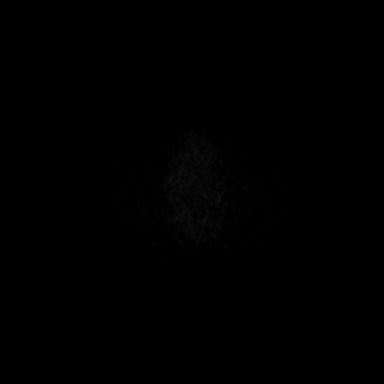

[Series 6: ax dwi_adc · axial · 3.0mm · 0.60mm/px · z∈[-56,+111]mm · 4 of 57 slices shown]
[im 1/57]
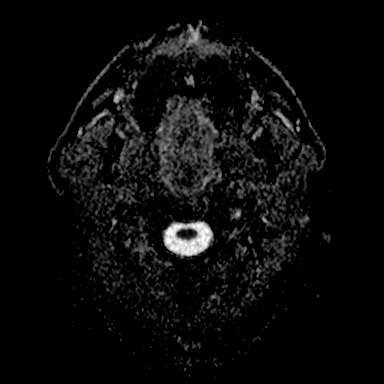
[im 19/57]
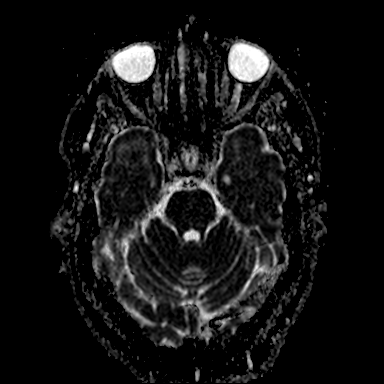
[im 38/57]
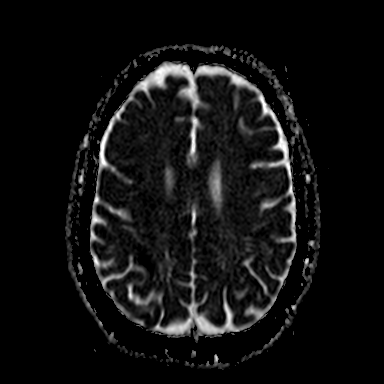
[im 57/57]
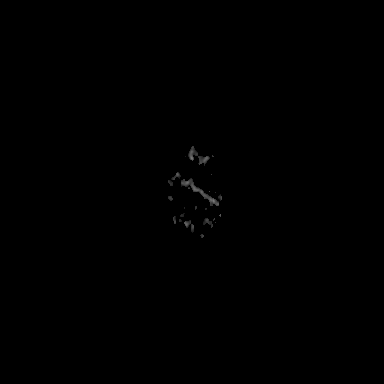

[Series 7: cor dwi_tracew · coronal · 5.0mm · 0.60mm/px · 3 of 42 slices shown]
[im 1/42]
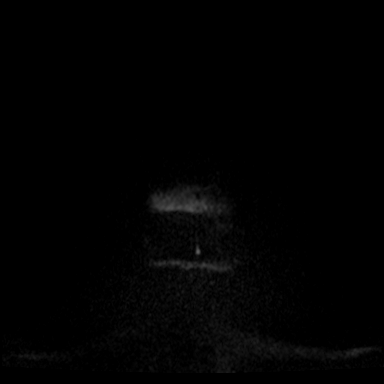
[im 21/42]
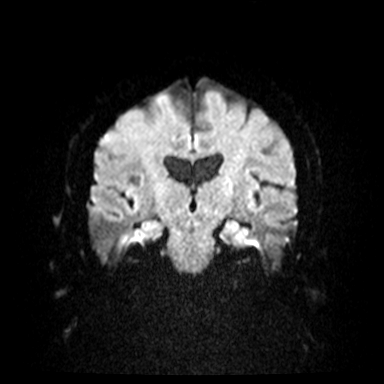
[im 42/42]
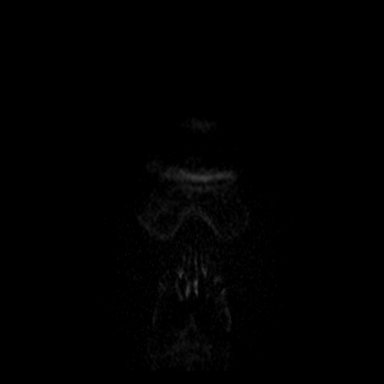

[Series 8: cor dwi_adc · coronal · 5.0mm · 0.60mm/px · 3 of 42 slices shown]
[im 1/42]
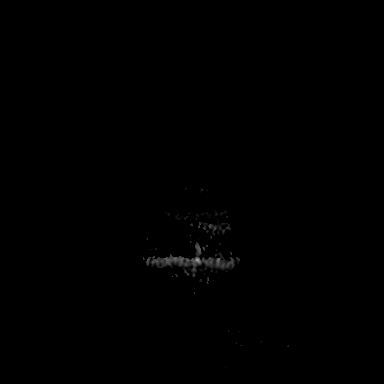
[im 21/42]
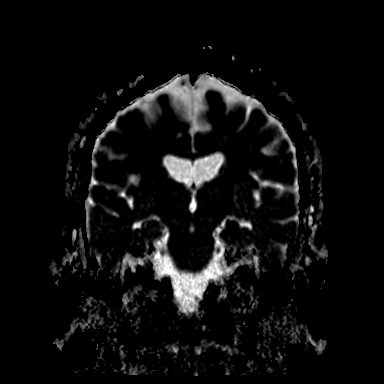
[im 42/42]
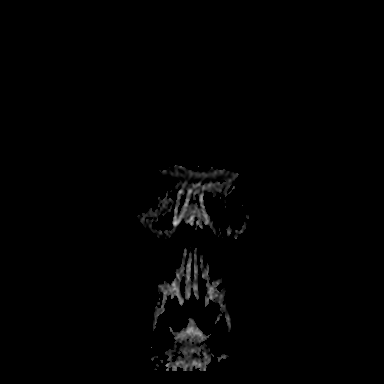

[Series 9: T1 · sagittal · 5.0mm · 0.62mm/px · 2 of 23 slices shown (1 of 2)]
[im 1/23]
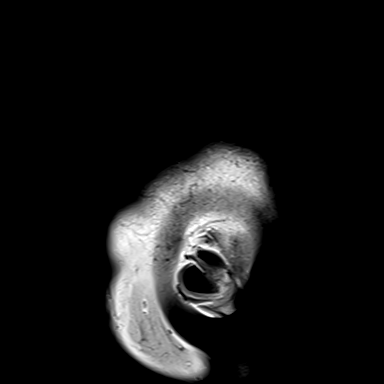
[im 23/23]
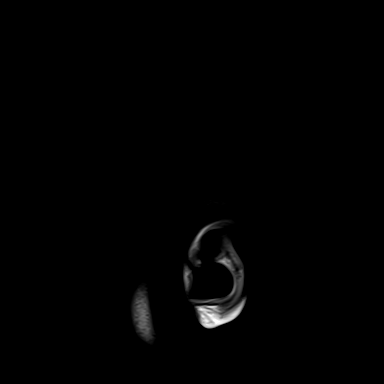

[Series 10: T2 · axial · 5.0mm · 0.53mm/px · z∈[-48,+107]mm · 2 of 27 slices shown (1 of 2)]
[im 1/27]
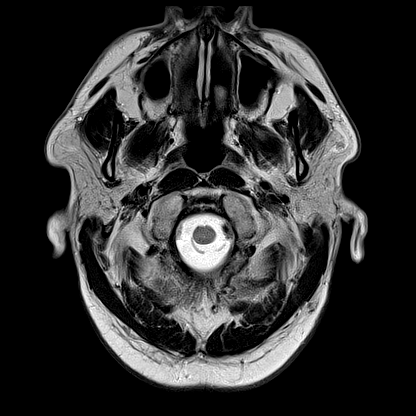
[im 27/27]
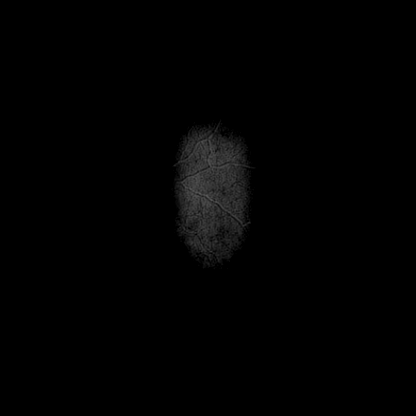

[Series 11: mag_images · axial · 3.0mm · 0.90mm/px · z∈[-57,+118]mm · 4 of 60 slices shown]
[im 1/60]
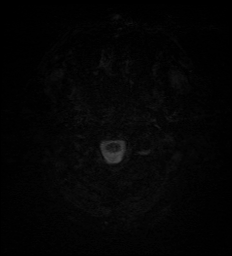
[im 20/60]
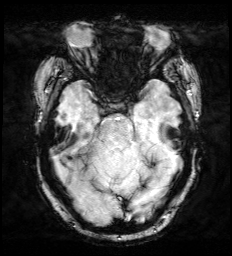
[im 40/60]
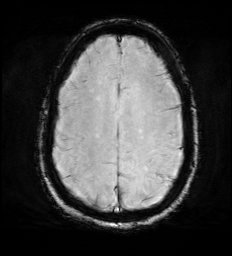
[im 60/60]
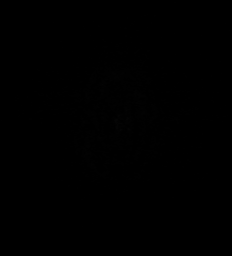

[Series 12: pha_images · axial · 3.0mm · 0.90mm/px · z∈[-57,+118]mm · 4 of 60 slices shown]
[im 1/60]
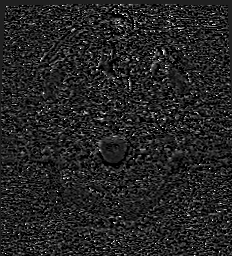
[im 20/60]
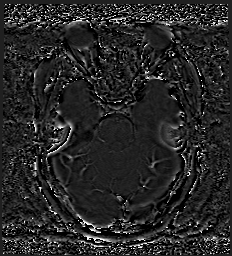
[im 40/60]
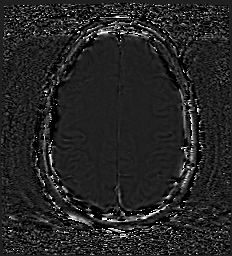
[im 60/60]
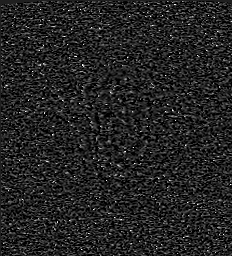

[Series 13: swi_images · axial · 3.0mm · 0.90mm/px · z∈[-57,+59]mm · 3 of 60 slices shown]
[im 1/60]
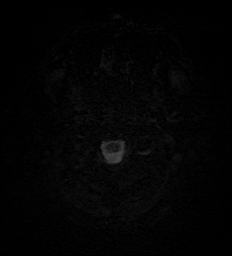
[im 20/60]
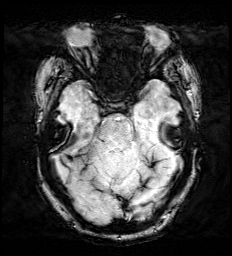
[im 40/60]
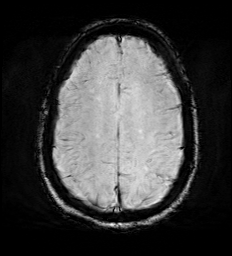

[Series 15: FLAIR · axial · 3.0mm · 0.53mm/px · z∈[-51,+110]mm · 4 of 55 slices shown]
[im 1/55]
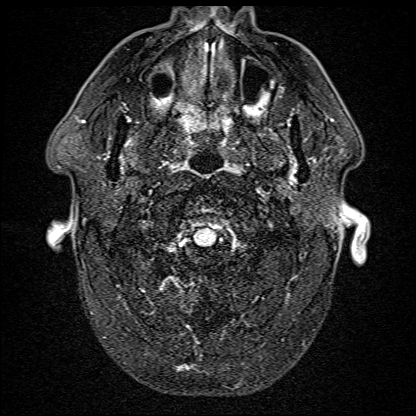
[im 19/55]
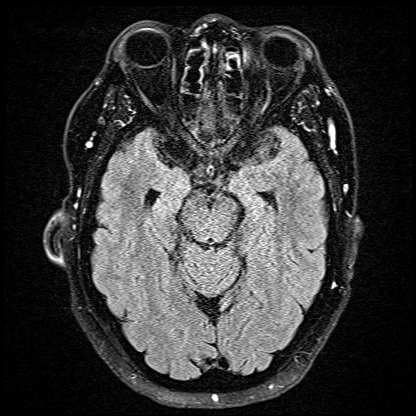
[im 37/55]
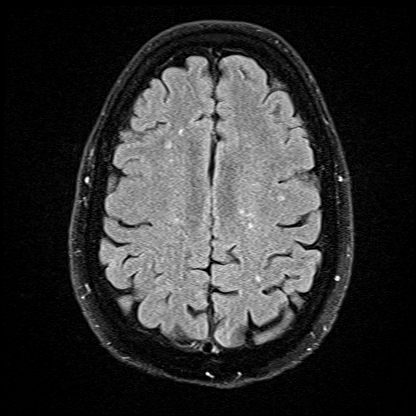
[im 55/55]
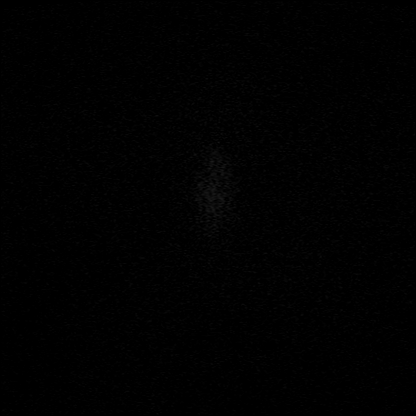

[Series 16: T1 · axial · 1.0mm · 0.98mm/px · z∈[-55,+118]mm · 8 of 175 slices shown (2 of 2)]
[im 1/175]
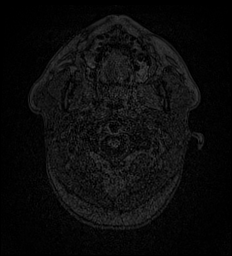
[im 32/175]
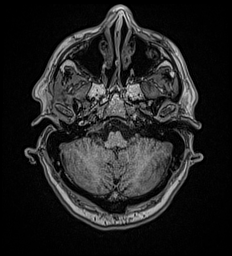
[im 48/175]
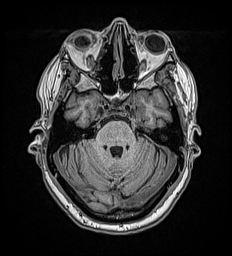
[im 80/175]
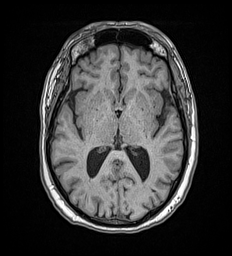
[im 95/175]
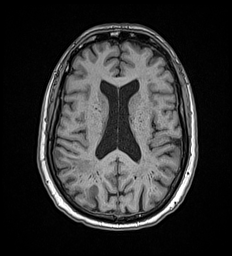
[im 127/175]
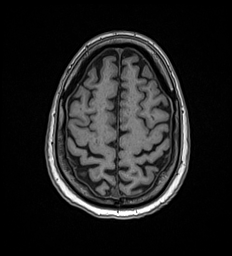
[im 143/175]
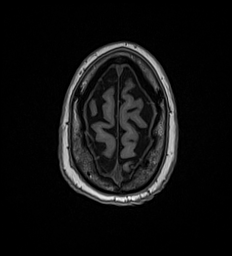
[im 175/175]
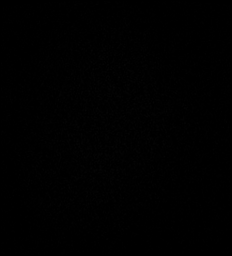

[Series 17: T2 · coronal · 5.0mm · 0.60mm/px · 2 of 31 slices shown (2 of 2)]
[im 1/31]
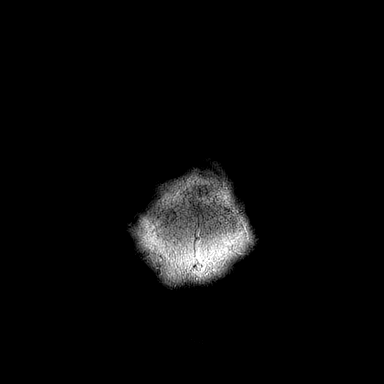
[im 31/31]
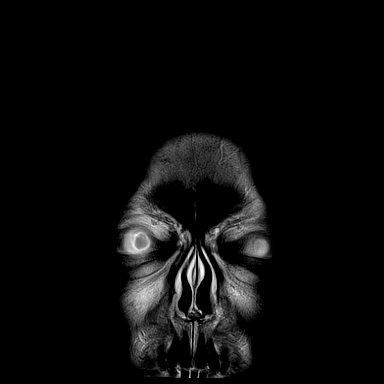

[43 of 48 positions shown; findings below may reference images not displayed]

FINDINGS: Brain: Diffusion imaging shows patchy areas of acute infarction in
the right parietal lobe consistent with right MCA branch vessel
territory infarction. Low level petechial blood products present in
some of the areas of involvement. No frank hematoma. Mild brain
swelling but no mass effect or shift. Elsewhere, there are mild
chronic small-vessel ischemic changes of the hemispheric white
matter. No sign of mass lesion, hydrocephalus or extra-axial
collection.

Vascular: Major vessels at the base of the brain show flow.

Skull and upper cervical spine: Negative

Sinuses/Orbits: Mild mucosal thickening of the paranasal sinuses.
Previous functional endoscopic sinus surgery. Orbits negative.

Other: None
IMPRESSION: Areas of acute infarction in the right parietal lobe consistent with
right MCA branch vessel infarction. Mild swelling. Some petechial
blood products, particularly along the posterior areas of
involvement. No frank hematoma. No mass effect.

Mild chronic small-vessel ischemic change elsewhere.

## 2020-09-30 ENCOUNTER — Ambulatory Visit
Admission: RE | Admit: 2020-09-30 | Discharge: 2020-09-30 | Disposition: A | Discharge status: Home / Self Care | Payer: Medicare Other | Source: Ambulatory Visit | Attending: Rheumatology | Admitting: Rheumatology

## 2020-09-30 ENCOUNTER — Other Ambulatory Visit: Payer: Self-pay | Admitting: Rheumatology

## 2020-09-30 ENCOUNTER — Other Ambulatory Visit: Payer: Self-pay

## 2020-09-30 DIAGNOSIS — M545 Low back pain, unspecified: Secondary | ICD-10-CM | POA: Diagnosis present

## 2020-11-20 DIAGNOSIS — Z9842 Cataract extraction status, left eye: Secondary | ICD-10-CM | POA: Insufficient documentation

## 2020-11-20 DIAGNOSIS — H269 Unspecified cataract: Secondary | ICD-10-CM | POA: Insufficient documentation

## 2020-11-26 ENCOUNTER — Other Ambulatory Visit: Payer: Self-pay | Admitting: Internal Medicine

## 2020-11-26 DIAGNOSIS — K7469 Other cirrhosis of liver: Secondary | ICD-10-CM

## 2020-12-03 ENCOUNTER — Ambulatory Visit
Admission: RE | Admit: 2020-12-03 | Discharge: 2020-12-03 | Disposition: A | Discharge status: Home / Self Care | Payer: Medicare Other | Source: Ambulatory Visit | Attending: Internal Medicine | Admitting: Internal Medicine

## 2020-12-03 ENCOUNTER — Other Ambulatory Visit: Payer: Self-pay

## 2020-12-03 DIAGNOSIS — K7469 Other cirrhosis of liver: Secondary | ICD-10-CM | POA: Diagnosis not present

## 2020-12-10 DIAGNOSIS — Z789 Other specified health status: Secondary | ICD-10-CM

## 2020-12-15 ENCOUNTER — Ambulatory Visit
Admission: RE | Admit: 2020-12-15 | Discharge: 2020-12-15 | Disposition: A | Discharge status: Home / Self Care | Payer: Medicare Other | Attending: Cardiology | Admitting: Cardiology

## 2020-12-15 ENCOUNTER — Encounter: Payer: Self-pay | Admitting: Cardiology

## 2020-12-15 ENCOUNTER — Other Ambulatory Visit: Payer: Self-pay

## 2020-12-15 ENCOUNTER — Encounter
Admission: RE | Disposition: A | Discharge status: Home / Self Care | Payer: Self-pay | Source: Home / Self Care | Attending: Cardiology

## 2020-12-15 DIAGNOSIS — Z4509 Encounter for adjustment and management of other cardiac device: Secondary | ICD-10-CM | POA: Diagnosis present

## 2020-12-15 DIAGNOSIS — Z789 Other specified health status: Secondary | ICD-10-CM

## 2020-12-15 HISTORY — PX: LOOP RECORDER REMOVAL: EP1215

## 2020-12-15 SURGERY — LOOP RECORDER REMOVAL
Anesthesia: LOCAL

## 2020-12-15 MED ORDER — LIDOCAINE-EPINEPHRINE (PF) 1 %-1:200000 IJ SOLN
INTRAMUSCULAR | Status: DC | PRN
  Administered 2020-12-15: 20 mL

## 2020-12-15 MED ORDER — LIDOCAINE HCL 1 % IJ SOLN
INTRAMUSCULAR | Status: AC
  Filled 2020-12-15: qty 20

## 2020-12-15 MED ORDER — LIDOCAINE-EPINEPHRINE (PF) 1 %-1:200000 IJ SOLN
INTRAMUSCULAR | Status: AC
  Filled 2020-12-15: qty 10

## 2020-12-15 SURGICAL SUPPLY — 4 items
DRAPE BRACHIAL (DRAPES) ×2 IMPLANT
PACK LOOP INSERTION (CUSTOM PROCEDURE TRAY) ×2 IMPLANT
SUT VIC AB 2-0 CT1 27 (SUTURE) ×1
SUT VIC AB 2-0 CT1 TAPERPNT 27 (SUTURE) ×1 IMPLANT

## 2020-12-15 NOTE — Discharge Instructions (Signed)
Patient may shower 12/17/2020, leave bandage on.

## 2021-01-01 ENCOUNTER — Other Ambulatory Visit: Payer: Self-pay

## 2021-01-01 ENCOUNTER — Ambulatory Visit (INDEPENDENT_AMBULATORY_CARE_PROVIDER_SITE_OTHER): Payer: Medicare Other | Admitting: Vascular Surgery

## 2021-01-01 VITALS — BP 165/81 | HR 65 | Ht 74.0 in | Wt 238.0 lb

## 2021-01-01 DIAGNOSIS — E1140 Type 2 diabetes mellitus with diabetic neuropathy, unspecified: Secondary | ICD-10-CM

## 2021-01-01 DIAGNOSIS — I2694 Multiple subsegmental pulmonary emboli without acute cor pulmonale: Secondary | ICD-10-CM | POA: Diagnosis not present

## 2021-01-01 DIAGNOSIS — H25813 Combined forms of age-related cataract, bilateral: Secondary | ICD-10-CM | POA: Insufficient documentation

## 2021-01-01 DIAGNOSIS — Z48810 Encounter for surgical aftercare following surgery on the sense organs: Secondary | ICD-10-CM | POA: Insufficient documentation

## 2021-01-01 DIAGNOSIS — R569 Unspecified convulsions: Secondary | ICD-10-CM | POA: Insufficient documentation

## 2021-01-01 DIAGNOSIS — I10 Essential (primary) hypertension: Secondary | ICD-10-CM | POA: Diagnosis not present

## 2021-01-01 DIAGNOSIS — K7469 Other cirrhosis of liver: Secondary | ICD-10-CM | POA: Insufficient documentation

## 2021-01-01 NOTE — Assessment & Plan Note (Signed)
blood glucose control important in reducing the progression of atherosclerotic disease. Also, involved in wound healing. On appropriate medications.  

## 2021-01-01 NOTE — Assessment & Plan Note (Signed)
blood pressure control important in reducing the progression of atherosclerotic disease. On appropriate oral medications.  

## 2021-01-01 NOTE — Assessment & Plan Note (Signed)
The patient had a significant pulmonary embolus about 1 year ago.  He is tolerating 2.5 mg of Eliquis twice daily, and I would continue this therapy indefinitely.  He is now getting his prescriptions through the [place] and he is unlikely to require any vascular intervention in the near future.  I will see him back on an as-needed basis.

## 2021-01-01 NOTE — Progress Notes (Signed)
MRN : [id number]  Jason Doyle is a 75 y.o. (May 25, 1945) male who presents with chief complaint of  Chief Complaint  Patient presents with   Follow-up    6 mo no studies  .  History of Present Illness: Patient returns today in follow up of his pulmonary embolus.  He is about a year out from a pulmonary embolus at this point.  At 6 months, we decreased the dose of Eliquis was 5 mg to 2-1/2 mg twice daily.  He has tolerated this well.  He has had no bleeding issues.  He had no recurrent chest pain or shortness of breath.  He has not had any significant leg pain or swelling.  He seems to be tolerating therapy just fine.  No new complaints today  Current Outpatient Medications  Medication Sig Dispense Refill   albuterol (VENTOLIN HFA) 108 (90 Base) MCG/ACT inhaler Inhale 2 puffs into the lungs every 6 (six) hours as needed for wheezing or shortness of breath.     allopurinol (ZYLOPRIM) 100 MG tablet Take 100 mg by mouth daily. am     amLODipine (NORVASC) 5 MG tablet Take 5 mg by mouth daily at 6 (six) AM.     amoxicillin (AMOXIL) 875 MG tablet Take 875 mg by mouth 2 (two) times daily.     apixaban (ELIQUIS) 5 MG TABS tablet Take 2 tablets (10mg ) twice daily for 7 days, then 1 tablet (5mg ) twice daily (Patient taking differently: Take 2.5 mg by mouth 2 (two) times daily.) 60 tablet 0   aspirin 81 MG chewable tablet Chew 1 tablet (81 mg total) by mouth daily. Start on 08/31/2018 no earlier then that please thank you 120 tablet 0   atorvastatin (LIPITOR) 80 MG tablet TAKE ONE TABLET BY MOUTH AT BEDTIME FOR CHOLESTEROL     Azelastine-Fluticasone 137-50 MCG/ACT SUSP Place 1 spray into the nose 2 (two) times daily as needed (congestion).      empagliflozin (JARDIANCE) 25 MG TABS tablet Take 12.5 mg by mouth in the morning and at bedtime.     ENBREL SURECLICK 50 MG/ML injection Inject 50 mg into the skin every Wednesday.     fluticasone (FLONASE) 50 MCG/ACT nasal spray Place 1 spray into the nose  daily as needed for allergies.     glipiZIDE (GLUCOTROL XL) 2.5 MG 24 hr tablet Take 2.5 mg by mouth daily with breakfast.     ketorolac (ACULAR) 0.5 % ophthalmic solution      lamoTRIgine (LAMICTAL) 100 MG tablet Take 100 mg by mouth 2 (two) times daily.     lisinopril (PRINIVIL,ZESTRIL) 40 MG tablet Take 40 mg by mouth daily.     loratadine (CLARITIN) 10 MG tablet Take 10 mg by mouth daily.     meclizine (ANTIVERT) 25 MG tablet Take 1 tablet (25 mg total) by mouth 3 (three) times daily as needed for dizziness. 30 tablet 0   metaxalone (SKELAXIN) 800 MG tablet Take 800 mg by mouth at bedtime.     montelukast (SINGULAIR) 10 MG tablet Take 10 mg by mouth at bedtime.     oxybutynin (DITROPAN) 5 MG tablet Take 5 mg by mouth 2 (two) times daily.     tamsulosin (FLOMAX) 0.4 MG CAPS capsule Take 0.4 mg by mouth daily after breakfast.     tiZANidine (ZANAFLEX) 4 MG tablet Take 4 mg by mouth in the morning and at bedtime.     WIXELA INHUB 250-50 MCG/DOSE AEPB Inhale 1 puff into the  lungs 2 (two) times daily.     No current facility-administered medications for this visit.    Past Medical History:  Diagnosis Date   Ankylosing spondylitis ([place])    Arthritis    knee,hands,back and neck   Asthma    LAST ATTACK 4-5 YRS AGO   BPH (benign prostatic hyperplasia)    CVA (cerebral vascular accident) ([place])    08-20-2018 per patient   Diabetes mellitus without complication ([place])    TYPE 2   Gout    H/O multiple allergies    SEASONAL,ENVIRONMENTAL   H/O sinus surgery    x4   Headache    sinus   Hypertension    Pulmonary embolism ([place]) 12/18/2019   Rotator cuff tear    left shoulder   Sleep apnea    C-PAP    Past Surgical History:  Procedure Laterality Date   BICEPT TENODESIS Left 07/23/2014   Procedure: BICEPS TENODESIS;  Surgeon: [name], MD;  Location: [place];  Service: Orthopedics;  Laterality: Left;   COLONOSCOPY WITH PROPOFOL N/A 04/10/2017   Procedure: COLONOSCOPY  WITH PROPOFOL;  Surgeon: [name], MD;  Location: [facility] ENDOSCOPY;  Service: Endoscopy;  Laterality: N/A;   KNEE ARTHROSCOPY     LOOP RECORDER INSERTION N/A 11/20/2018   Procedure: LOOP RECORDER INSERTION;  Surgeon: Isaias Cowman, MD;  Location: [place] CV LAB;  Service: Cardiovascular;  Laterality: N/A;   LOOP RECORDER REMOVAL N/A 12/15/2020   Procedure: LOOP RECORDER REMOVAL;  Surgeon: Isaias Cowman, MD;  Location: [place] CV LAB;  Service: Cardiovascular;  Laterality: N/A;   NASAL SINUS SURGERY     x 4   SHOULDER ARTHROSCOPY WITH ROTATOR CUFF REPAIR AND SUBACROMIAL DECOMPRESSION Left 07/23/2014   Procedure: SHOULDER ARTHROSCOPY WITH ROTATOR CUFF REPAIR AND SUBACROMIAL DECOMPRESSION;  Surgeon: [name], MD;  Location: [place];  Service: Orthopedics;  Laterality: Left;   UVULOPALATOPHARYNGOPLASTY       Social History   Tobacco Use   Smoking status: Never   Smokeless tobacco: Never  Vaping Use   Vaping Use: Never used  Substance Use Topics   Alcohol use: Not Currently    Comment: RARELY "1/2 glass wine a year"   Drug use: Not Currently      No family history on file.   Allergies  Allergen Reactions   Metformin Diarrhea    REVIEW OF SYSTEMS (Negative unless checked)   Constitutional: [] Weight loss  [] Fever  [] Chills Cardiac: [] Chest pain   [] Chest pressure   [] Palpitations   [] Shortness of breath when laying flat   [] Shortness of breath at rest   [] Shortness of breath with exertion. Vascular:  [] Pain in legs with walking   [] Pain in legs at rest   [] Pain in legs when laying flat   [] Claudication   [] Pain in feet when walking  [] Pain in feet at rest  [] Pain in feet when laying flat   [x] History of DVT   [x] Phlebitis   [] Swelling in legs   [] Varicose veins   [] Non-healing ulcers Pulmonary:   [] Uses home oxygen   [] Productive cough   [] Hemoptysis   [] Wheeze  [] COPD   [] Asthma Neurologic:  [] Dizziness  [] Blackouts   [] Seizures    [] History of stroke   [] History of TIA  [] Aphasia   [] Temporary blindness   [] Dysphagia   [] Weakness or numbness in arms   [] Weakness or numbness in legs Musculoskeletal:  [x] Arthritis   [] Joint swelling   [] Joint pain   [] Low  back pain Hematologic:  [] Easy bruising  [] Easy bleeding   [] Hypercoagulable state   [] Anemic   Gastrointestinal:  [] Blood in stool   [] Vomiting blood  [] Gastroesophageal reflux/heartburn   [] Abdominal pain Genitourinary:  [] Chronic kidney disease   [] Difficult urination  [] Frequent urination  [] Burning with urination   [] Hematuria Skin:  [] Rashes   [] Ulcers   [] Wounds Psychological:  [] History of anxiety   []  History of major depression.   Physical Examination  BP (!) 165/81   Pulse 65   Ht 6\' 2"  (1.88 m)   Wt 238 lb (108 kg)   BMI 30.56 kg/m  Gen:  WD/WN, NAD.  Appears younger than stated age Head: [place]/AT, No temporalis wasting. Ear/Nose/Throat: Hearing grossly intact, nares w/o erythema or drainage Eyes: Conjunctiva clear. Sclera non-icteric Neck: Supple.  Trachea midline Pulmonary:  Good air movement, no use of accessory muscles.  Cardiac: RRR, no JVD Vascular:  Vessel Right Left  Radial Palpable Palpable                          PT Palpable Palpable  DP Palpable Palpable   Gastrointestinal: soft, non-tender/non-distended. No guarding/reflex.  Musculoskeletal: M/S 5/5 throughout.  No deformity or atrophy.  No significant lower extremity edema. Neurologic: Sensation grossly intact in extremities.  Symmetrical.  Speech is fluent.  Psychiatric: Judgment intact, Mood & affect appropriate for pt's clinical situation. Dermatologic: No rashes or ulcers noted.  No cellulitis or open wounds.      Labs No results found for this or any previous visit (from the past 2160 hour(s)).  Radiology EP PPM/ICD IMPLANT  Result Date: 12/15/2020 Successful removal implantable loop recorder   US Abdomen Limited RUQ (LIVER/GB)  Result Date:  12/03/2020 CLINICAL DATA:  Cirrhosis EXAM: ULTRASOUND ABDOMEN LIMITED RIGHT UPPER QUADRANT COMPARISON:  Ultrasound 07/17/2017 FINDINGS: Gallbladder: No gallstones or wall thickening visualized. No sonographic Murphy sign noted by sonographer. Common bile duct: Diameter: 2.7 mm Liver: Slightly coarse echogenic hepatic parenchyma presumably corresponding to history of cirrhosis. No focal hepatic abnormality. Portal vein is patent on color Doppler imaging with normal direction of blood flow towards the liver. Other: None. IMPRESSION: Coarse slightly echogenic hepatic parenchyma consistent with hepatocellular disease. Negative for focal hepatic mass lesion by sonography Electronically Signed   By: Donavan Foil M.D.   On: 12/03/2020 23:35    Assessment/Plan  Pulmonary embolism [facility]) The patient had a significant pulmonary embolus about 1 year ago.  He is tolerating 2.5 mg of Eliquis twice daily, and I would continue this therapy indefinitely.  He is now getting his prescriptions through the [place] and he is unlikely to require any vascular intervention in the near future.  I will see him back on an as-needed basis.  Benign essential hypertension blood pressure control important in reducing the progression of atherosclerotic disease. On appropriate oral medications.   Type 2 diabetes mellitus with neurologic complication, without long-term current use of insulin (HCC) blood glucose control important in reducing the progression of atherosclerotic disease. Also, involved in wound healing. On appropriate medications.    Leotis Pain, MD  01/01/2021 11:18 AM    This note was created with Dragon medical transcription system.  Any errors from dictation are purely unintentional

## 2021-11-23 ENCOUNTER — Other Ambulatory Visit (HOSPITAL_COMMUNITY): Payer: Self-pay | Admitting: Internal Medicine

## 2021-11-23 ENCOUNTER — Other Ambulatory Visit: Payer: Self-pay | Admitting: Internal Medicine

## 2021-11-23 DIAGNOSIS — M5416 Radiculopathy, lumbar region: Secondary | ICD-10-CM

## 2021-11-23 DIAGNOSIS — M450 Ankylosing spondylitis of multiple sites in spine: Secondary | ICD-10-CM

## 2021-11-26 ENCOUNTER — Ambulatory Visit
Admission: RE | Admit: 2021-11-26 | Discharge: 2021-11-26 | Disposition: A | Discharge status: Home / Self Care | Payer: Medicare Other | Source: Ambulatory Visit | Attending: Internal Medicine | Admitting: Internal Medicine

## 2021-11-26 DIAGNOSIS — M5416 Radiculopathy, lumbar region: Secondary | ICD-10-CM | POA: Diagnosis present

## 2021-11-26 DIAGNOSIS — M450 Ankylosing spondylitis of multiple sites in spine: Secondary | ICD-10-CM | POA: Insufficient documentation

## 2021-11-30 ENCOUNTER — Other Ambulatory Visit: Payer: Self-pay | Admitting: Internal Medicine

## 2021-11-30 DIAGNOSIS — K7469 Other cirrhosis of liver: Secondary | ICD-10-CM

## 2021-12-07 ENCOUNTER — Ambulatory Visit
Admission: RE | Admit: 2021-12-07 | Discharge: 2021-12-07 | Disposition: A | Discharge status: Home / Self Care | Payer: Medicare Other | Source: Ambulatory Visit | Attending: Internal Medicine | Admitting: Internal Medicine

## 2021-12-07 DIAGNOSIS — K7469 Other cirrhosis of liver: Secondary | ICD-10-CM

## 2021-12-13 ENCOUNTER — Encounter (INDEPENDENT_AMBULATORY_CARE_PROVIDER_SITE_OTHER): Payer: Self-pay

## 2022-03-28 ENCOUNTER — Ambulatory Visit
Admission: RE | Admit: 2022-03-28 | Discharge: 2022-03-28 | Disposition: A | Discharge status: Home / Self Care | Payer: Medicare Other | Source: Ambulatory Visit | Attending: Physician Assistant | Admitting: Physician Assistant

## 2022-03-28 ENCOUNTER — Other Ambulatory Visit: Payer: Self-pay | Admitting: Physician Assistant

## 2022-03-28 DIAGNOSIS — M7989 Other specified soft tissue disorders: Secondary | ICD-10-CM

## 2022-03-28 DIAGNOSIS — M79662 Pain in left lower leg: Secondary | ICD-10-CM | POA: Diagnosis present

## 2024-01-23 ENCOUNTER — Other Ambulatory Visit: Payer: Self-pay | Admitting: Internal Medicine

## 2024-01-23 DIAGNOSIS — R0789 Other chest pain: Secondary | ICD-10-CM

## 2024-01-30 ENCOUNTER — Encounter (HOSPITAL_COMMUNITY): Payer: Self-pay

## 2024-01-31 ENCOUNTER — Telehealth (HOSPITAL_COMMUNITY): Payer: Self-pay | Admitting: *Deleted

## 2024-01-31 NOTE — Telephone Encounter (Signed)
Reaching out to patient to offer assistance regarding upcoming cardiac imaging study; pt verbalizes understanding of appt date/time, parking situation and where to check in, pre-test NPO status and medications ordered, and verified current allergies; name and call back number provided for further questions should they arise  Larey Brick RN Navigator Cardiac Imaging [facility] [phone] office [phone] cell  Patient to take 25mg  metoprolol tartrate two hours prior to his cardiac CT scan.

## 2024-02-01 ENCOUNTER — Ambulatory Visit: Admission: RE | Admit: 2024-02-01 | Discharge: 2024-02-01 | Attending: Internal Medicine | Admitting: Internal Medicine

## 2024-02-01 DIAGNOSIS — R0789 Other chest pain: Secondary | ICD-10-CM | POA: Insufficient documentation

## 2024-02-01 DIAGNOSIS — I2510 Atherosclerotic heart disease of native coronary artery without angina pectoris: Secondary | ICD-10-CM | POA: Insufficient documentation

## 2024-02-01 MED ORDER — DILTIAZEM HCL 25 MG/5ML IV SOLN
10.0000 mg | INTRAVENOUS | Status: DC | PRN
  Filled 2024-02-01: qty 5

## 2024-02-01 MED ORDER — NITROGLYCERIN 0.4 MG SL SUBL
0.8000 mg | SUBLINGUAL_TABLET | Freq: Once | SUBLINGUAL | Status: AC
  Administered 2024-02-01: 10:00:00 0.8 mg via SUBLINGUAL
  Filled 2024-02-01: qty 25

## 2024-02-01 MED ORDER — IOHEXOL 350 MG/ML SOLN
100.0000 mL | Freq: Once | INTRAVENOUS | Status: AC | PRN
  Administered 2024-02-01: 10:00:00 100 mL via INTRAVENOUS

## 2024-02-01 MED ORDER — METOPROLOL TARTRATE 5 MG/5ML IV SOLN
10.0000 mg | Freq: Once | INTRAVENOUS | Status: DC | PRN
  Filled 2024-02-01: qty 10

## 2024-02-01 NOTE — Progress Notes (Signed)
 Patient tolerated procedure well. Ambulate w/o difficulty. Denies any lightheadedness or being dizzy. Pt denies any pain at this time. Sitting in chair. Pt is encouraged to drink additional water throughout the day and reason explained to patient. Patient verbalized understanding and all questions answered. ABC intact. No further needs at this time. Discharge from procedure area w/o issues.

## 2024-02-19 ENCOUNTER — Other Ambulatory Visit
